# Patient Record
Sex: Male | Born: 1960 | Race: Black or African American | Hispanic: No | Marital: Single | Smoking: Never smoker
Health system: Southern US, Community
[De-identification: ages and names within clinical notes are randomized; demographics above are authoritative.]

## PROBLEM LIST (undated history)

## (undated) DIAGNOSIS — R51 Headache: Secondary | ICD-10-CM

## (undated) DIAGNOSIS — R519 Headache, unspecified: Secondary | ICD-10-CM

## (undated) DIAGNOSIS — I10 Essential (primary) hypertension: Secondary | ICD-10-CM

## (undated) HISTORY — PX: WISDOM TOOTH EXTRACTION: SHX21

## (undated) HISTORY — DX: Headache: R51

## (undated) HISTORY — DX: Essential (primary) hypertension: I10

## (undated) HISTORY — DX: Headache, unspecified: R51.9

---

## 2003-04-26 ENCOUNTER — Ambulatory Visit (HOSPITAL_COMMUNITY): Admission: RE | Admit: 2003-04-26 | Discharge: 2003-04-26 | Payer: Self-pay | Admitting: Internal Medicine

## 2003-04-26 ENCOUNTER — Encounter: Payer: Self-pay | Admitting: Internal Medicine

## 2003-05-31 ENCOUNTER — Ambulatory Visit (HOSPITAL_COMMUNITY): Admission: RE | Admit: 2003-05-31 | Discharge: 2003-05-31 | Payer: Self-pay | Admitting: Orthopedic Surgery

## 2003-05-31 ENCOUNTER — Encounter: Payer: Self-pay | Admitting: Orthopedic Surgery

## 2012-07-09 ENCOUNTER — Ambulatory Visit: Payer: BC Managed Care – PPO

## 2012-07-09 ENCOUNTER — Ambulatory Visit (INDEPENDENT_AMBULATORY_CARE_PROVIDER_SITE_OTHER): Payer: BC Managed Care – PPO | Admitting: Internal Medicine

## 2012-07-09 VITALS — BP 143/86 | HR 66 | Temp 98.4°F | Resp 12 | Ht 72.0 in | Wt 147.0 lb

## 2012-07-09 DIAGNOSIS — M25569 Pain in unspecified knee: Secondary | ICD-10-CM

## 2012-07-09 DIAGNOSIS — M7050 Other bursitis of knee, unspecified knee: Secondary | ICD-10-CM

## 2012-07-09 MED ORDER — PREDNISONE 20 MG PO TABS: ORAL_TABLET | ORAL | Status: DC

## 2012-07-09 MED ORDER — MELOXICAM 15 MG PO TABS: 15.0000 mg | ORAL_TABLET | Freq: Every day | ORAL | Status: DC

## 2012-07-09 NOTE — Progress Notes (Signed)
  Subjective:    Patient ID: Logan Larson, male    DOB: 05/13/1961, 50 y.o.   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 Geralyn Flash      Assessment & Plan:  P#1 Infrapat bursitis  Meds ordered this encounter  Medications  . predniSONE (DELTASONE) 20 MG tablet    Sig: 3/3/2/2/1/1/ single daily dose for 6 days    Dispense:  12 tablet    Refill:  0  . meloxicam (MOBIC) 15 MG tablet    Sig: Take 1 tablet (15 mg total) by mouth daily.    Dispense:  30 tablet    Refill:  0   Brace OOW 3-7 days w/ f/u 1 week unless 100% He is reluctant to take sick days

## 2012-07-10 ENCOUNTER — Encounter: Payer: Self-pay | Admitting: Internal Medicine

## 2012-07-11 ENCOUNTER — Telehealth: Payer: Self-pay

## 2012-07-11 DIAGNOSIS — M25469 Effusion, unspecified knee: Secondary | ICD-10-CM

## 2012-07-11 NOTE — Telephone Encounter (Signed)
PT STATES WE WERE GOING TO CALL REGARDING HIS XRAYS AND HE HASN'T HEARD FROM ANYONE. ALSO NEED TO HAVE THE OOW NOTE MODIFIED PLEASE CALL [phone]

## 2012-07-11 NOTE — Telephone Encounter (Signed)
Large knee effusion. No osseous abnormality. Question internal  derangement of the knee. Consider MRI for further assessment.   Called patient Dr Merla Riches has recommended a MRI scan, he is advised of the report. He states swelling is going down. I have clarified his work note and left at front desk for him to pick up. MRI ordered.

## 2012-07-16 ENCOUNTER — Ambulatory Visit
Admission: RE | Admit: 2012-07-16 | Discharge: 2012-07-16 | Disposition: A | Discharge status: Home / Self Care | Payer: BC Managed Care – PPO | Source: Ambulatory Visit | Attending: Internal Medicine | Admitting: Internal Medicine

## 2012-07-16 DIAGNOSIS — M25469 Effusion, unspecified knee: Secondary | ICD-10-CM

## 2012-07-21 ENCOUNTER — Telehealth: Payer: Self-pay

## 2012-07-21 DIAGNOSIS — M25461 Effusion, right knee: Secondary | ICD-10-CM

## 2012-07-21 DIAGNOSIS — M25569 Pain in unspecified knee: Secondary | ICD-10-CM

## 2012-07-21 DIAGNOSIS — S83249A Other tear of medial meniscus, current injury, unspecified knee, initial encounter: Secondary | ICD-10-CM

## 2012-07-21 NOTE — Telephone Encounter (Signed)
Called pt and spoke to Denita on pt's HIPPA. She explained that pt was on other phone. Gave her pt's results and explained pt needs referral to ortho surg. She agreed for [place] to start referral. Put in order for referral.

## 2012-07-21 NOTE — Telephone Encounter (Signed)
Pt would like to know results of mri best number is [phone]

## 2012-08-25 ENCOUNTER — Ambulatory Visit (INDEPENDENT_AMBULATORY_CARE_PROVIDER_SITE_OTHER): Payer: BC Managed Care – PPO | Admitting: Emergency Medicine

## 2012-08-25 VITALS — BP 173/108 | HR 64 | Temp 98.3°F | Resp 16 | Ht 69.0 in

## 2012-08-25 DIAGNOSIS — R079 Chest pain, unspecified: Secondary | ICD-10-CM

## 2012-08-25 DIAGNOSIS — M940 Chondrocostal junction syndrome [Tietze]: Secondary | ICD-10-CM

## 2012-08-25 MED ORDER — NAPROXEN SODIUM 550 MG PO TABS: 550.0000 mg | ORAL_TABLET | Freq: Two times a day (BID) | ORAL | Status: AC

## 2012-08-25 NOTE — Patient Instructions (Addendum)
Costochondritis Costochondritis (Tietze syndrome), or costochondral separation, is a swelling and irritation (inflammation) of the tissue (cartilage) that connects your ribs with your breastbone (sternum). It may occur on its own (spontaneously), through damage caused by an accident (trauma), or simply from coughing or minor exercise. It may take up to 6 weeks to get better and longer if you are unable to be conservative in your activities. HOME CARE INSTRUCTIONS   Avoid exhausting physical activity. Try not to strain your ribs during normal activity. This would include any activities using chest, belly (abdominal), and side muscles, especially if heavy weights are used.  Use ice for 15 to 20 minutes per hour while awake for the first 2 days. Place the ice in a plastic bag, and place a towel between the bag of ice and your skin.  Only take over-the-counter or prescription medicines for pain, discomfort, or fever as directed by your caregiver. SEEK IMMEDIATE MEDICAL CARE IF:   Your pain increases or you are very uncomfortable.  You have a fever.  You develop difficulty with your breathing.  You cough up blood.  You develop worse chest pains, shortness of breath, sweating, or vomiting.  You develop new, unexplained problems (symptoms). MAKE SURE YOU:   Understand these instructions.  Will watch your condition.  Will get help right away if you are not doing well or get worse. Document Released: 05/19/2005 Document Revised: 11/01/2011 Document Reviewed: 03/27/2008 ExitCare Patient Information 2013 [employer].  

## 2012-08-25 NOTE — Progress Notes (Signed)
[facility] [address], [place] [address] [phone] [phone]  Date:  08/25/2012   Name:  Logan Larson   DOB:  01-08-1961   MRN:  [id number]  PCP:  No primary provider on file.    Chief Complaint: Shoulder Pain   History of Present Illness:  Logan Larson is a 52 y.o. very pleasant male patient who presents with the following:  Burning pain in anterior chest below right clavicle.  Started in posterior shoulder yesterday no history of overuse or injury.  No shoulder pain.  No chest pain.  No fever, chills, cough, nausea or vomiting.  No wheezing or shortness of breath.  There is no problem list on file for this patient.   No past medical history on file.  No past surgical history on file.  History  Substance Use Topics  . Smoking status: Never Smoker   . Smokeless tobacco: Not on file  . Alcohol Use: No    No family history on file.  No Known Allergies  Medication list has been reviewed and updated.  Current Outpatient Prescriptions on File Prior to Visit  Medication Sig Dispense Refill  . meloxicam (MOBIC) 15 MG tablet Take 1 tablet (15 mg total) by mouth daily.  30 tablet  0  . predniSONE (DELTASONE) 20 MG tablet 3/3/2/2/1/1/ single daily dose for 6 days  12 tablet  0    Review of Systems:  As per HPI, otherwise negative.    Physical Examination: Filed Vitals:   08/25/12 1444  BP: 173/108  Pulse: 64  Temp: 98.3 F (36.8 C)  Resp: 16   Filed Vitals:   08/25/12 1444  Height: 5\' 9"  (1.753 m)   There is no weight on file to calculate BMI. Ideal Body Weight: Weight in (lb) to have BMI = 25: 168.9    GEN: WDWN, NAD, Non-toxic, Alert & Oriented x 3 HEENT: Atraumatic, Normocephalic.  Ears and Nose: No external deformity. EXTR: No clubbing/cyanosis/edema NEURO: Normal gait.  PSYCH: Normally interactive. Conversant. Not depressed or anxious appearing.  Calm demeanor.  CHEST WALL:  Tender points 3, 4, 5th costochondral junctions.   Reproduce pain he has experienced  Assessment and Plan: costochondrosis Anaprox Follow up as needed Injected two ribs with 1 ml marcaine and 80 mg depo medrol each site   Carmelina Dane, MD

## 2013-03-22 ENCOUNTER — Ambulatory Visit: Payer: BC Managed Care – PPO

## 2013-03-22 ENCOUNTER — Ambulatory Visit (INDEPENDENT_AMBULATORY_CARE_PROVIDER_SITE_OTHER): Payer: BC Managed Care – PPO | Admitting: Internal Medicine

## 2013-03-22 VITALS — BP 130/82 | HR 58 | Temp 97.7°F | Resp 18 | Ht 72.5 in | Wt 150.0 lb

## 2013-03-22 DIAGNOSIS — M7052 Other bursitis of knee, left knee: Secondary | ICD-10-CM

## 2013-03-22 DIAGNOSIS — M2540 Effusion, unspecified joint: Secondary | ICD-10-CM

## 2013-03-22 DIAGNOSIS — M76899 Other specified enthesopathies of unspecified lower limb, excluding foot: Secondary | ICD-10-CM

## 2013-03-22 DIAGNOSIS — M6740 Ganglion, unspecified site: Secondary | ICD-10-CM

## 2013-03-22 DIAGNOSIS — M67462 Ganglion, left knee: Secondary | ICD-10-CM

## 2013-03-22 DIAGNOSIS — M25462 Effusion, left knee: Secondary | ICD-10-CM

## 2013-03-22 NOTE — Progress Notes (Signed)
  Subjective:    Patient ID: Logan Larson, male    DOB: [birth year], 52 y.o.   MRN: [id number]  HPI Pt presents to clinic today with L knee swelling. He had R arthroscopic knee surgery about 5 weeks ago and he thinks he may have been walking different the past couple weeks since the surgery. Earlier this week he describes that he kneeled down to bend his knees to work on ROM and since then he noticed some L knee swelling but no pain. The surgery was on right knee. 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   Review of Systems     Objective:   Physical Exam  Constitutional: He is oriented to person, place, and time. He appears well-developed and well-nourished. No distress.  HENT:  Nose: Nose normal.  Eyes: EOM are normal.  Pulmonary/Chest: Effort normal.  Musculoskeletal: Normal range of motion. He exhibits edema. He exhibits no tenderness.       Left knee: He exhibits swelling. He exhibits normal range of motion, no effusion, no ecchymosis, no deformity, no erythema, normal alignment, no LCL laxity, no bony tenderness, normal meniscus and no MCL laxity. Tenderness found. Patellar tendon tenderness noted. No medial joint line, no lateral joint line, no MCL and no LCL tenderness noted.       Legs: Cystic swelling under patella tendon.  Neurological: He is alert and oriented to person, place, and time. He exhibits normal muscle tone. Coordination normal.  Skin: No rash noted. No erythema.  Psychiatric: He has a normal mood and affect. His behavior is normal.   [facility] reading (PRIMARY) by  Dr Perrin Maltese normal knee xr  Sterile prep/Xylocain local/Aspirate swelling and no fluid obtained Sterile dressing.       Assessment & Plan:  Infra patella cystic swelling/Probable ganglion Wear knee pads/See orthopedist

## 2013-03-22 NOTE — Patient Instructions (Signed)
Ganglion Cyst °A ganglion cyst is a noncancerous, fluid-filled lump that occurs near joints or tendons. The ganglion cyst grows out of a joint or the lining of a tendon. It most often develops in the hand or wrist but can also develop in the shoulder, elbow, hip, knee, ankle, or foot. The round or oval ganglion can be pea sized or larger than a grape. Increased activity may enlarge the size of the cyst because more fluid starts to build up.  °CAUSES  °It is not completely known what causes a ganglion cyst to grow. However, it may be related to: °· Inflammation or irritation around the joint. °· An injury. °· Repetitive movements or overuse. °· Arthritis. °SYMPTOMS  °A lump most often appears in the hand or wrist, but can occur in other areas of the body. Generally, the lump is painless without other symptoms. However, sometimes pain can be felt during activity or when pressure is applied to the lump. The lump may even be tender to the touch. Tingling, pain, numbness, or muscle weakness can occur if the ganglion cyst presses on a nerve. Your grip may be weak and you may have less movement in your joints.  °DIAGNOSIS  °Ganglion cysts are most often diagnosed based on a physical exam, noting where the cyst is and how it looks. Your caregiver will feel the lump and may shine a light alongside it. If it is a ganglion, a light often shines through it. Your caregiver may order an X-ray, ultrasound, or MRI to rule out other conditions. °TREATMENT  °Ganglions usually go away on their own without treatment. If pain or other symptoms are involved, treatment may be needed. Treatment is also needed if the ganglion limits your movement or if it gets infected. Treatment options include: °· Wearing a wrist or finger brace or splint. °· Taking anti-inflammatory medicine. °· Draining fluid from the lump with a needle (aspiration). °· Injecting a steroid into the joint. °· Surgery to remove the ganglion cyst and its stalk that is  attached to the joint or tendon. However, ganglion cysts can grow back. °HOME CARE INSTRUCTIONS  °· Do not press on the ganglion, poke it with a needle, or hit it with a heavy object. You may rub the lump gently and often. Sometimes fluid moves out of the cyst. °· Only take medicines as directed by your caregiver. °· Wear your brace or splint as directed by your caregiver. °SEEK MEDICAL CARE IF:  °· Your ganglion becomes larger or more painful. °· You have increased redness, red streaks, or swelling. °· You have pus coming from the lump. °· You have weakness or numbness in the affected area. °MAKE SURE YOU:  °· Understand these instructions. °· Will watch your condition. °· Will get help right away if you are not doing well or get worse. °Document Released: 08/06/2000 Document Revised: 05/03/2012 Document Reviewed: 10/03/2007 °ExitCare® Patient Information ©2014 [employer]. ° °

## 2013-08-23 HISTORY — PX: KNEE SURGERY: SHX244

## 2014-01-07 IMAGING — CR DG KNEE 1-2V*R*
2 series · 2 of 2 positions shown · non-contrast
Comparison: None.

CLINICAL DATA: Right knee pain.  No injury.

RIGHT KNEE - 1-2 VIEW

[AP]
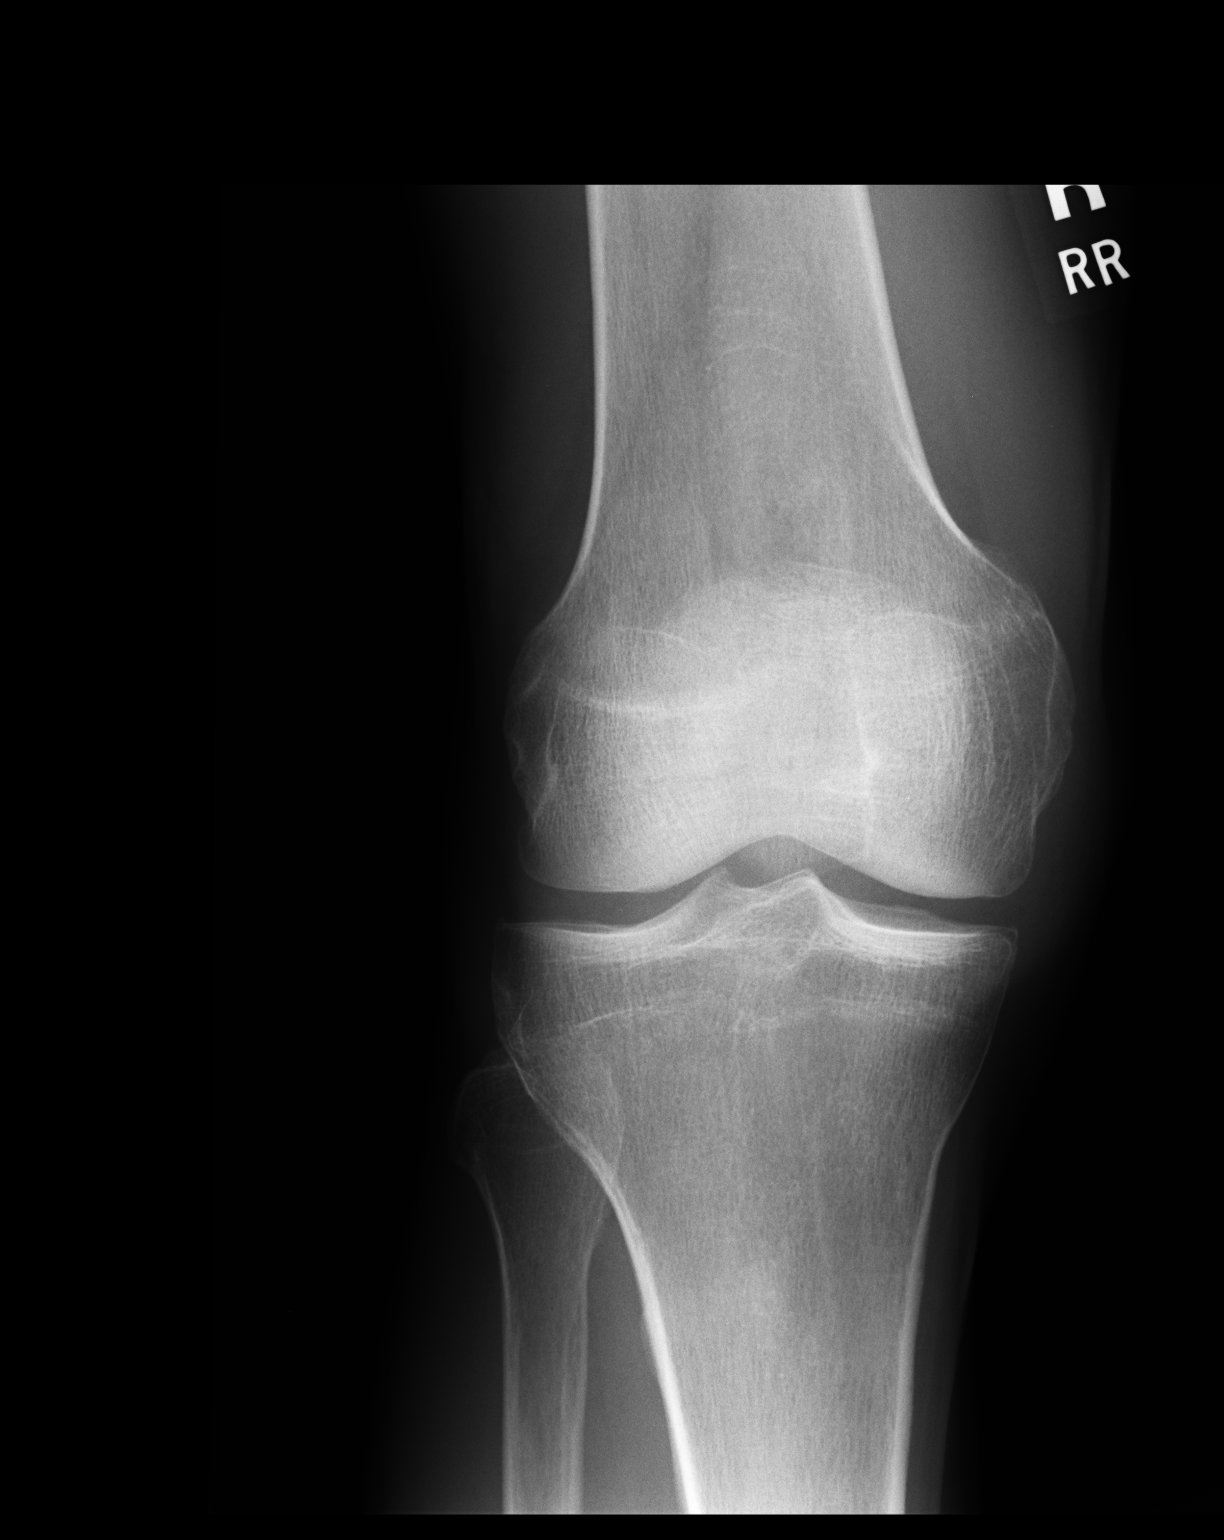

[lateral]
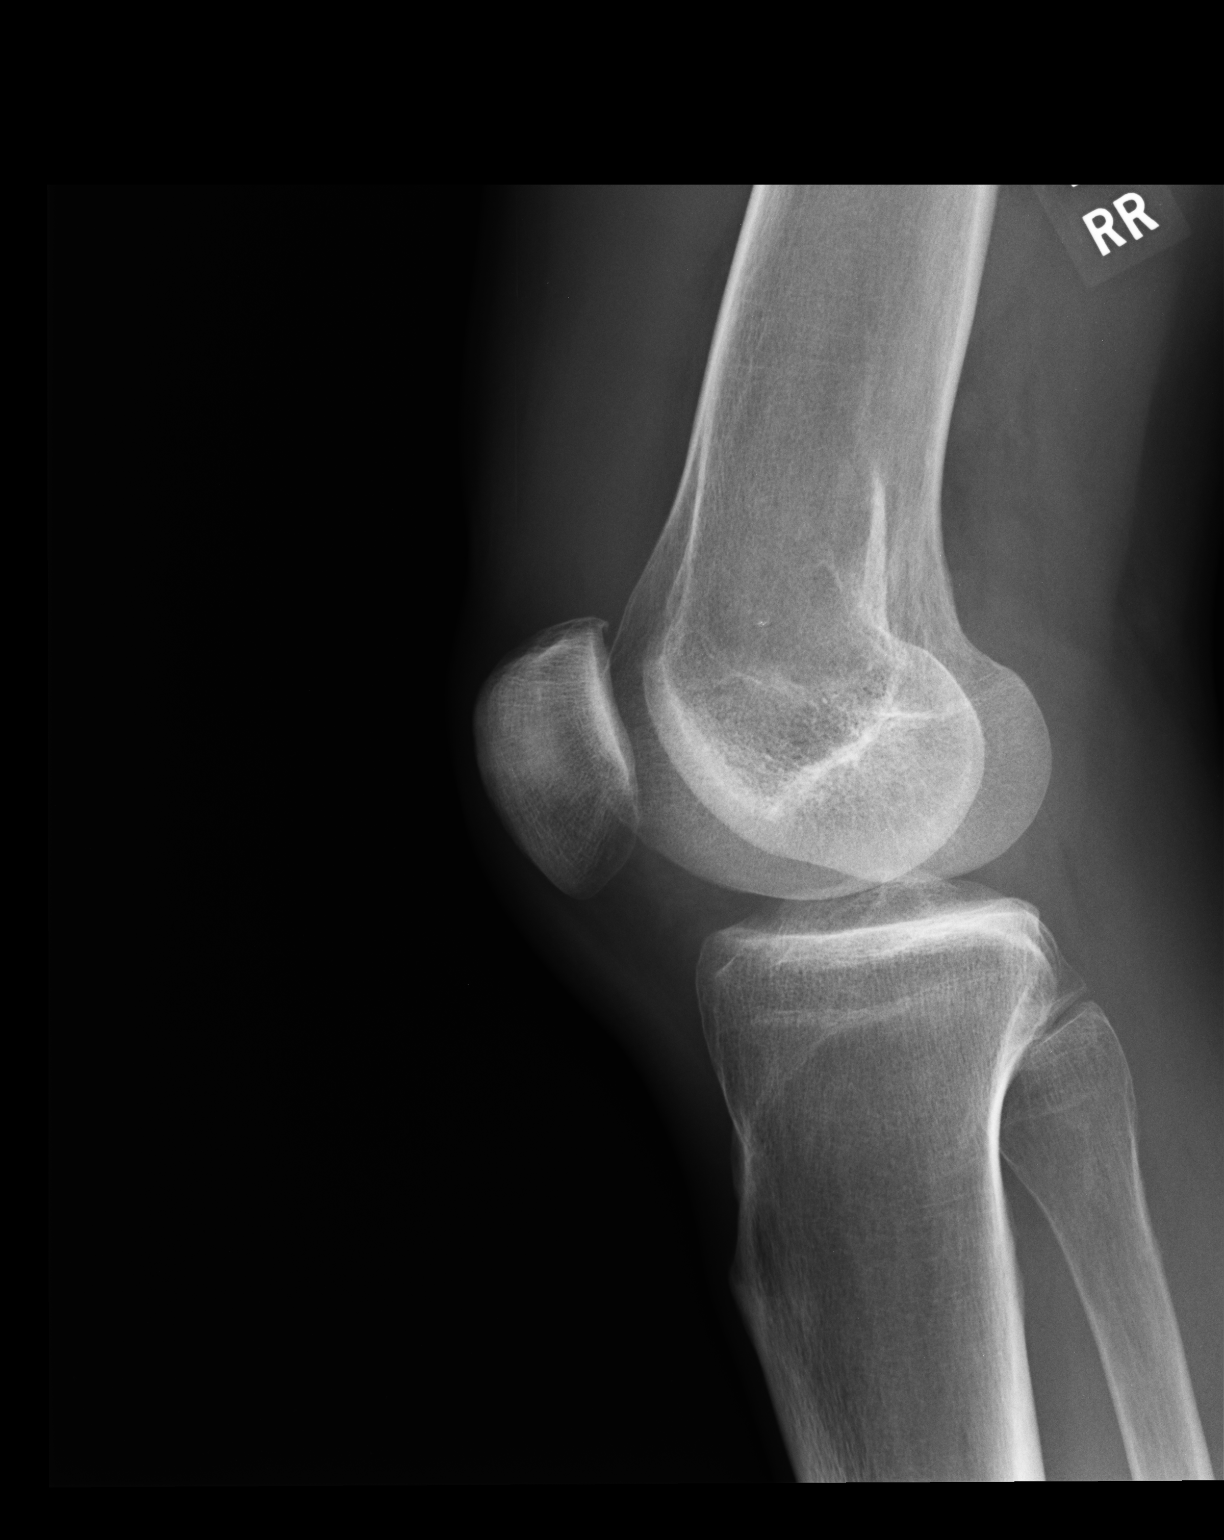

[2 of 2 positions shown; findings below may reference images not displayed]

FINDINGS: Anatomic alignment.  Large effusion.  Mild patellofemoral
osteoarthritis.
IMPRESSION: Large knee effusion.  No osseous abnormality.  Question internal
derangement of the knee.  Consider MRI for further assessment.

## 2015-11-26 DIAGNOSIS — M9905 Segmental and somatic dysfunction of pelvic region: Secondary | ICD-10-CM | POA: Diagnosis not present

## 2015-11-26 DIAGNOSIS — M9903 Segmental and somatic dysfunction of lumbar region: Secondary | ICD-10-CM | POA: Diagnosis not present

## 2015-11-26 DIAGNOSIS — M5126 Other intervertebral disc displacement, lumbar region: Secondary | ICD-10-CM | POA: Diagnosis not present

## 2015-11-26 DIAGNOSIS — M9904 Segmental and somatic dysfunction of sacral region: Secondary | ICD-10-CM | POA: Diagnosis not present

## 2016-03-18 ENCOUNTER — Encounter (HOSPITAL_COMMUNITY): Payer: Self-pay | Admitting: Emergency Medicine

## 2016-03-18 ENCOUNTER — Ambulatory Visit (HOSPITAL_COMMUNITY)
Admission: EM | Admit: 2016-03-18 | Discharge: 2016-03-18 | Disposition: A | Discharge status: Home / Self Care | Payer: BLUE CROSS/BLUE SHIELD | Attending: Physician Assistant | Admitting: Physician Assistant

## 2016-03-18 ENCOUNTER — Ambulatory Visit (INDEPENDENT_AMBULATORY_CARE_PROVIDER_SITE_OTHER): Payer: BLUE CROSS/BLUE SHIELD

## 2016-03-18 DIAGNOSIS — M940 Chondrocostal junction syndrome [Tietze]: Secondary | ICD-10-CM

## 2016-03-18 DIAGNOSIS — R0789 Other chest pain: Secondary | ICD-10-CM | POA: Diagnosis not present

## 2016-03-18 DIAGNOSIS — R079 Chest pain, unspecified: Secondary | ICD-10-CM | POA: Diagnosis not present

## 2016-03-18 MED ORDER — HYDROCODONE-ACETAMINOPHEN 5-325 MG PO TABS: 1.0000 | ORAL_TABLET | Freq: Four times a day (QID) | ORAL | 0 refills | Status: DC | PRN

## 2016-03-18 MED ORDER — KETOROLAC TROMETHAMINE 60 MG/2ML IM SOLN
60.0000 mg | Freq: Once | INTRAMUSCULAR | Status: AC
  Administered 2016-03-18: 60 mg via INTRAMUSCULAR

## 2016-03-18 MED ORDER — KETOROLAC TROMETHAMINE 60 MG/2ML IM SOLN
INTRAMUSCULAR | Status: AC
  Filled 2016-03-18: qty 2

## 2016-03-18 MED ORDER — INDOMETHACIN 50 MG PO CAPS: 50.0000 mg | ORAL_CAPSULE | Freq: Two times a day (BID) | ORAL | 0 refills | Status: DC

## 2016-03-18 NOTE — Discharge Instructions (Signed)
CALL ME IF THERE ARE ANY NEW OR WORSENING OF SYMPTOMS.  [phone] Barbra Sarks

## 2016-03-18 NOTE — ED Triage Notes (Signed)
The patient presented to the [facility] with a complaint of right sided chest pain that radiates to his right shoulder. The patient described the pain as a muscle spasm. The patient denied any cardia hx.

## 2016-03-18 NOTE — ED Provider Notes (Signed)
CSN: [id number]     Arrival date & time 03/18/16  1123 History   First MD Initiated Contact with Patient 03/18/16 1214     Chief Complaint  Patient presents with  . Chest Pain  . Shoulder Pain   (Consider location/radiation/quality/duration/timing/severity/associated sxs/prior Treatment) HPI History obtained from patient:  Pt presents with the cc of:  Right upper chest wall pain Duration of symptoms: One week Treatment prior to arrival: No treatment Context: Sudden onset of right upper chest wall pain radiating to his right shoulder. No known injury. Patient works as Naval architect. Other symptoms include: Pain with movement and deep breathing Pain score: 4 or 5 FAMILY HISTORY: Heart disease    History reviewed. No pertinent past medical history. Past Surgical History:  Procedure Laterality Date  . KNEE SURGERY     History reviewed. No pertinent family history. Social History  Substance Use Topics  . Smoking status: Never Smoker  . Smokeless tobacco: Never Used  . Alcohol use No    Review of Systems  Denies: HEADACHE, NAUSEA, ABDOMINAL PAIN,   CONGESTION, DYSURIA, SHORTNESS OF BREATH  Allergies  Review of patient's allergies indicates no known allergies.  Home Medications   Prior to Admission medications   Medication Sig Start Date End Date Taking? Authorizing Provider  HYDROcodone-acetaminophen (NORCO/VICODIN) 5-325 MG tablet Take 1 tablet by mouth every 6 (six) hours as needed. 03/18/16   Tharon Aquas, PA  indomethacin (INDOCIN) 50 MG capsule Take 1 capsule (50 mg total) by mouth 2 (two) times daily with a meal. 03/18/16   Tharon Aquas, PA  meloxicam (MOBIC) 15 MG tablet Take 1 tablet (15 mg total) by mouth daily. 07/09/12   Tonye Pearson, MD  predniSONE (DELTASONE) 20 MG tablet 3/3/2/2/1/1/ single daily dose for 6 days 07/09/12   Tonye Pearson, MD   Meds Ordered and Administered this Visit   Medications  ketorolac (TORADOL) injection 60 mg (60 mg  Intramuscular Given 03/18/16 1318)    Pulse (!) 58 Comment: Reported HR to [facility]  Temp 98.1 F (36.7 C) (Oral)   Resp 14   SpO2 100%  No data found.   Physical Exam NURSES NOTES AND VITAL SIGNS REVIEWED. CONSTITUTIONAL: Well developed, well nourished, no acute distress HEENT: normocephalic, atraumatic EYES: Conjunctiva normal NECK:normal ROM, supple, no adenopathy PULMONARY:No respiratory distress, normal effort ABDOMINAL: Soft, ND, NT BS+, No CVAT MUSCULOSKELETAL: Normal ROM of all extremities, right upper chest wall is point tender to palpation. Pain is worse with movement.  SKIN: warm and dry without rash PSYCHIATRIC: Mood and affect, behavior are normal  Urgent Care Course   Clinical Course    Procedures (including critical care time)  Labs Review Labs Reviewed - No data to display  Imaging Review Dg Chest 2 View  Result Date: 03/18/2016 CLINICAL DATA:  Chest pain for 1 week EXAM: CHEST  2 VIEW COMPARISON:  None. FINDINGS: There is no edema or consolidation. Heart is upper normal in size with pulmonary vascularity within normal limits. No adenopathy. There is a small focus of calcification in the aortic arch. No bone lesions. IMPRESSION: Mild aortic atherosclerosis.  No edema or consolidation. Electronically Signed   By: Bretta Bang III M.D.   On: 03/18/2016 12:48  dISCUSSED WITH PATIENT AND WIFE PRIOR TO DISCHARGE  Visual Acuity Review  Right Eye Distance:   Left Eye Distance:   Bilateral Distance:    Right Eye Near:   Left Eye Near:    Bilateral Near:  POINT TENDER CHEST WALL PAIN. NOT suspicious for cardiac disease. Symptoms have been present for over 1 week.   Prescriptions for indomethacin and hydrocodone provided.  MDM   1. Chest wall pain   2. Costochondritis    NURSES NOTES AND VITAL SIGNS REVIEWED. CONSTITUTIONAL: Well developed, well nourished, no acute distress HEENT: normocephalic, atraumatic EYES: Conjunctiva  normal NECK:normal ROM, supple, no adenopathy PULMONARY:No respiratory distress, normal effort ABDOMINAL: Soft, ND, NT BS+, No CVAT MUSCULOSKELETAL: Normal ROM of all extremities,  SKIN: warm and dry without rash PSYCHIATRIC: Mood and affect, behavior are normal     Tharon Aquas, PA 03/18/16 1329

## 2016-12-01 ENCOUNTER — Ambulatory Visit (HOSPITAL_COMMUNITY)
Admission: EM | Admit: 2016-12-01 | Discharge: 2016-12-01 | Disposition: A | Discharge status: Home / Self Care | Payer: BLUE CROSS/BLUE SHIELD | Attending: Family Medicine | Admitting: Family Medicine

## 2016-12-01 ENCOUNTER — Encounter (HOSPITAL_COMMUNITY): Payer: Self-pay | Admitting: Emergency Medicine

## 2016-12-01 DIAGNOSIS — I10 Essential (primary) hypertension: Secondary | ICD-10-CM

## 2016-12-01 DIAGNOSIS — G44209 Tension-type headache, unspecified, not intractable: Secondary | ICD-10-CM

## 2016-12-01 DIAGNOSIS — Z566 Other physical and mental strain related to work: Secondary | ICD-10-CM | POA: Diagnosis not present

## 2016-12-01 MED ORDER — NAPROXEN 375 MG PO TABS: 375.0000 mg | ORAL_TABLET | Freq: Two times a day (BID) | ORAL | 0 refills | Status: DC

## 2016-12-01 NOTE — ED Notes (Signed)
No stickers

## 2016-12-01 NOTE — ED Triage Notes (Addendum)
Intermittent headache and intermittent chest discomfort, intermittent.  "pain going in and out, doesn't hurt, hurt".  Denies cough, cold runny nose  Head is pounding, intermittent  Patient thinks this is stress related

## 2016-12-01 NOTE — ED Provider Notes (Signed)
CSN: [id number]     Arrival date & time 12/01/16  1400 History   None    Chief Complaint  Patient presents with  . Headache   (Consider location/radiation/quality/duration/timing/severity/associated sxs/prior Treatment) 56 year old male complaining of intermittent throbbing-type headaches for the past week associated with left anterior chest wall pain. 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 His wife states that she has noticed changes because of the stress. The symptoms did not start until he obtains second job which required a greater amount of work and he told. The headaches are located primarily bitemporal and occasionally occipital. They are not associated with problems with vision, speech, hearing, swallowing, focal paresthesias or weakness. No dizziness, syncope, confusion or problems with memory.  He states his chest pain is left anterior and is elicited with certain movements, coughing and deep breathing. No fever or chills. No feelings of pressure, heaviness or tightness. No known history of heart disease.      History reviewed. No pertinent past medical history. Past Surgical History:  Procedure Laterality Date  . KNEE SURGERY     No family history on file. Social History  Substance Use Topics  . Smoking status: Never Smoker  . Smokeless tobacco: Never Used  . Alcohol use No    Review of Systems  Constitutional: Negative.   HENT: Negative.   Respiratory: Negative.   Cardiovascular: Positive for chest pain.  Gastrointestinal: Negative.   Genitourinary: Negative.   Skin: Negative.   Neurological: Positive for headaches.  All other systems reviewed and are negative.   Allergies  Patient has no known allergies.  Home Medications   Prior to Admission medications   Medication Sig Start Date End Date Taking? Authorizing Provider   HYDROcodone-acetaminophen (NORCO/VICODIN) 5-325 MG tablet Take 1 tablet by mouth every 6 (six) hours as needed. 03/18/16   Tharon Aquas, PA  indomethacin (INDOCIN) 50 MG capsule Take 1 capsule (50 mg total) by mouth 2 (two) times daily with a meal. 03/18/16   Tharon Aquas, PA  meloxicam (MOBIC) 15 MG tablet Take 1 tablet (15 mg total) by mouth daily. 07/09/12   Tonye Pearson, MD  naproxen (NAPROSYN) 375 MG tablet Take 1 tablet (375 mg total) by mouth 2 (two) times daily. 12/01/16   Hayden Rasmussen, NP  predniSONE (DELTASONE) 20 MG tablet 3/3/2/2/1/1/ single daily dose for 6 days 07/09/12   Tonye Pearson, MD   Meds Ordered and Administered this Visit  Medications - No data to display  BP (!) 170/97 (BP Location: Left Arm)   Pulse 74   Temp 98.1 F (36.7 C) (Oral)   Resp 18   SpO2 97%  No data found.   Physical Exam  Constitutional: He is oriented to person, place, and time. He appears well-developed and well-nourished. No distress.  HENT:  Head: Normocephalic and atraumatic.  Eyes: EOM are normal. Pupils are equal, round, and reactive to light.  Neck: Normal range of motion. Neck supple.  Cardiovascular: Normal rate, regular rhythm, normal heart sounds and intact distal pulses.   Pulmonary/Chest: Effort normal and breath sounds normal. No respiratory distress. He has no wheezes. He has no rales. He exhibits tenderness.  Musculoskeletal: Normal range of motion.  Lymphadenopathy:    He has no cervical adenopathy.  Neurological: He is alert and oriented to person, place, and time. He has normal strength.  He displays normal reflexes. No cranial nerve deficit or sensory deficit. He exhibits normal muscle tone. Coordination normal. GCS eye subscore is 4. GCS verbal subscore is 5. GCS motor subscore is 6.  Skin: Skin is warm and dry.  Psychiatric: He has a normal mood and affect.  Nursing note and vitals reviewed.   Urgent Care Course     Procedures (including critical care  time)  Labs Review Labs Reviewed - No data to display  Imaging Review No results found.   Visual Acuity Review  Right Eye Distance:   Left Eye Distance:   Bilateral Distance:    Right Eye Near:   Left Eye Near:    Bilateral Near:         MDM   1. Acute non intractable tension-type headache   2. Hypertension, unspecified type   3. Stress at work    Off of work for the these 3 days. Follow-up with your primary care doctor on Wednesday. Meanwhile if you have headache or chest pain take the medicine prescribed. Best taken with food. Try some relaxation techniques. There are medicines that do help tolerate with stresses as well. Meds ordered this encounter  Medications  . naproxen (NAPROSYN) 375 MG tablet    Sig: Take 1 tablet (375 mg total) by mouth 2 (two) times daily.    Dispense:  20 tablet    Refill:  0    Order Specific Question:   Supervising Provider    Answer:   Elvina Sidle [[id number]]       Hayden Rasmussen, NP 12/01/16 1744

## 2016-12-01 NOTE — Discharge Instructions (Signed)
Off of work for the these 3 days. Follow-up with your primary care doctor on Wednesday. Meanwhile if you have headache or chest pain take the medicine prescribed. Best taken with food. Try some relaxation techniques. There are medicines that do help tolerate with stresses as well.

## 2016-12-08 ENCOUNTER — Encounter: Payer: Self-pay | Admitting: Adult Health

## 2016-12-08 ENCOUNTER — Ambulatory Visit (INDEPENDENT_AMBULATORY_CARE_PROVIDER_SITE_OTHER): Payer: BLUE CROSS/BLUE SHIELD | Admitting: Adult Health

## 2016-12-08 VITALS — BP 140/98 | Temp 98.6°F | Ht 72.5 in | Wt 152.4 lb

## 2016-12-08 DIAGNOSIS — Z7689 Persons encountering health services in other specified circumstances: Secondary | ICD-10-CM

## 2016-12-08 DIAGNOSIS — Z23 Encounter for immunization: Secondary | ICD-10-CM

## 2016-12-08 DIAGNOSIS — I10 Essential (primary) hypertension: Secondary | ICD-10-CM | POA: Diagnosis not present

## 2016-12-08 NOTE — Progress Notes (Signed)
Patient presents to clinic today to establish care. He is a pleasant 56 year old male who  has a past medical history of Frequent headaches.   He has never had a complete physical exam done.    Acute Concerns: Establish Care    Chronic Issues: Frequent  Headaches. - was seen in the ER for this 7 days ago. He reports that he believes his headaches are stress reduced. He has recently started a second job with the city of [employer]. He is on call a lot of the time and is constantly having to fix other workers mistakes. He was taken out of work after the ER visit and since that time his stress level has decreased and he is no longer experiencing headaches. No labs or imaging was done in the ER.   In the ER he also had an elevated blood pressure reading of 170/97. He denies being on blood pressure medication in the past. His blood pressure is lower today in the office 140/98  Health Maintenance: Dental -- Does do routine care [facility] -- 2016  Immunizations -- Needs Tdap  Colonoscopy -- Never had  Diet: Does not follow a specific diet. He eats a lot of fast food.  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 Frequent headaches     Past Surgical History:  Procedure Laterality Date  . KNEE SURGERY Right 2015    No current outpatient prescriptions on file prior to visit.   No current facility-administered medications on file prior to visit.     No Known Allergies  Family History  Problem Relation Age of Onset  . Heart disease Mother   . Hypertension Mother   . Diabetes Mother     Social History   Social History  . Marital status: Single    Spouse name: N/A  . Number of children: N/A  . Years of education: N/A   Occupational History  . Not on file.   Social History Main Topics  . Smoking status: Never Smoker  . Smokeless tobacco: Never Used  . Alcohol use No  . Drug use: No  . Sexual activity: Yes     Other Topics Concern  . Not on file   Social History Narrative  . No narrative on file    Review of Systems  Constitutional: Negative.   HENT: Negative.   Eyes: Negative.   Respiratory: Negative.   Cardiovascular: Negative.   Genitourinary: Negative.   Musculoskeletal: Negative.   Skin: Negative.   Neurological: Negative.   Psychiatric/Behavioral: Negative.   All other systems reviewed and are negative.   BP (!) 140/98 (BP Location: Left Arm, Patient Position: Sitting, Cuff Size: Normal)   Temp 98.6 F (37 C) (Oral)   Ht 6' 0.5" (1.842 m)   Wt 152 lb 6.4 oz (69.1 kg)   BMI 20.39 kg/m   Physical Exam  Constitutional: He is oriented to person, place, and time and well-developed, well-nourished, and in no distress. No distress.  HENT:  Head: Normocephalic and atraumatic.  Right Ear: External ear normal.  Left Ear: External ear normal.  Nose: Nose normal.  Mouth/Throat: Oropharynx is clear and moist. No oropharyngeal exudate.  Eyes: Conjunctivae and EOM are normal. Pupils are equal, round, and reactive to light. Right eye exhibits no discharge. Left eye exhibits no discharge.  Cardiovascular: Normal rate, regular rhythm, normal heart sounds and intact distal pulses.  Exam  reveals no gallop and no friction rub.   No murmur heard. Pulmonary/Chest: Effort normal and breath sounds normal. No respiratory distress. He has no wheezes. He has no rales. He exhibits no tenderness.  Neurological: He is alert and oriented to person, place, and time. Gait normal. GCS score is 15.  Skin: Skin is warm and dry. No rash noted. He is not diaphoretic. No erythema. No pallor.  Psychiatric: Mood, memory, affect and judgment normal.  Nursing note and vitals reviewed.  Assessment/Plan: 1. Encounter to establish care - Follow up for CPE or sooner if needed - Needs to make some dietary changes  - Continue to stay active   2. Need for Tdap vaccination  - Tdap vaccine greater than or equal  to 7yo IM  3. Hypertension, unspecified type - I am going to reassess in one month at his physical. I would like him to make some dietary changes. Possible stress response?  - Consider adding low dose ACE   Shirline Frees, NP

## 2016-12-08 NOTE — Patient Instructions (Addendum)
It was great meeting you today!   Please follow up with me in one month for your physical   Someone will call you to schedule your colonoscopy.   Please take some time for yourself

## 2017-01-11 ENCOUNTER — Telehealth: Payer: Self-pay | Admitting: Adult Health

## 2017-01-11 NOTE — Telephone Encounter (Signed)
Logan Larson (DPR) calling to check to see when or if the referral for the colonoscopy has been done stated it was on the AVS that someone will call to schedule that or what should they do.  She was advised to check with the insurance to see if a referral is needed.

## 2017-01-13 NOTE — Telephone Encounter (Signed)
Patient has CPE exam tomorrow 01/14/17 - Per Kandee Keenory, he will discuss with patient at office visit and put in order for colonoscopy then.

## 2017-01-14 ENCOUNTER — Encounter (INDEPENDENT_AMBULATORY_CARE_PROVIDER_SITE_OTHER): Payer: Self-pay

## 2017-01-14 ENCOUNTER — Ambulatory Visit (INDEPENDENT_AMBULATORY_CARE_PROVIDER_SITE_OTHER): Payer: BLUE CROSS/BLUE SHIELD | Admitting: Adult Health

## 2017-01-14 ENCOUNTER — Encounter: Payer: Self-pay | Admitting: Adult Health

## 2017-01-14 VITALS — BP 142/88 | Temp 97.9°F | Ht 72.5 in | Wt 142.8 lb

## 2017-01-14 DIAGNOSIS — Z1211 Encounter for screening for malignant neoplasm of colon: Secondary | ICD-10-CM | POA: Diagnosis not present

## 2017-01-14 DIAGNOSIS — Z1159 Encounter for screening for other viral diseases: Secondary | ICD-10-CM

## 2017-01-14 DIAGNOSIS — Z0000 Encounter for general adult medical examination without abnormal findings: Secondary | ICD-10-CM | POA: Diagnosis not present

## 2017-01-14 DIAGNOSIS — I10 Essential (primary) hypertension: Secondary | ICD-10-CM

## 2017-01-14 LAB — CBC WITH DIFFERENTIAL/PLATELET
BASOS ABS: 0 10*3/uL (ref 0.0–0.1)
Basophils Relative: 0.4 % (ref 0.0–3.0)
EOS ABS: 0.1 10*3/uL (ref 0.0–0.7)
Eosinophils Relative: 2 % (ref 0.0–5.0)
HCT: 44.3 % (ref 39.0–52.0)
Hemoglobin: 14.6 g/dL (ref 13.0–17.0)
LYMPHS ABS: 0.9 10*3/uL (ref 0.7–4.0)
Lymphocytes Relative: 16.7 % (ref 12.0–46.0)
MCHC: 33 g/dL (ref 30.0–36.0)
MCV: 90.8 fl (ref 78.0–100.0)
MONO ABS: 0.3 10*3/uL (ref 0.1–1.0)
Monocytes Relative: 5.8 % (ref 3.0–12.0)
NEUTROS PCT: 75.1 % (ref 43.0–77.0)
Neutro Abs: 4.1 10*3/uL (ref 1.4–7.7)
Platelets: 168 10*3/uL (ref 150.0–400.0)
RBC: 4.88 Mil/uL (ref 4.22–5.81)
RDW: 14.3 % (ref 11.5–15.5)
WBC: 5.5 10*3/uL (ref 4.0–10.5)

## 2017-01-14 LAB — LIPID PANEL
CHOL/HDL RATIO: 2
Cholesterol: 157 mg/dL (ref 0–200)
HDL: 72.5 mg/dL (ref >39.00)
LDL Cholesterol: 76 mg/dL (ref 0–99)
NONHDL: 84.21
TRIGLYCERIDES: 39 mg/dL (ref 0.0–149.0)
VLDL: 7.8 mg/dL (ref 0.0–40.0)

## 2017-01-14 LAB — BASIC METABOLIC PANEL
BUN: 17 mg/dL (ref 6–23)
CALCIUM: 9 mg/dL (ref 8.4–10.5)
CO2: 30 mEq/L (ref 19–32)
CREATININE: 1.08 mg/dL (ref 0.40–1.50)
Chloride: 108 mEq/L (ref 96–112)
GFR: 91.57 mL/min (ref >60.00)
GLUCOSE: 76 mg/dL (ref 70–99)
Potassium: 4.1 mEq/L (ref 3.5–5.1)
SODIUM: 144 meq/L (ref 135–145)

## 2017-01-14 LAB — PSA: PSA: 0.46 ng/mL (ref 0.10–4.00)

## 2017-01-14 LAB — HEPATIC FUNCTION PANEL
ALK PHOS: 69 U/L (ref 39–117)
ALT: 11 U/L (ref 0–53)
AST: 24 U/L (ref 0–37)
Albumin: 4.1 g/dL (ref 3.5–5.2)
BILIRUBIN DIRECT: 0.1 mg/dL (ref 0.0–0.3)
Total Bilirubin: 0.4 mg/dL (ref 0.2–1.2)
Total Protein: 7.2 g/dL (ref 6.0–8.3)

## 2017-01-14 LAB — TSH: TSH: 1.62 u[IU]/mL (ref 0.35–4.50)

## 2017-01-14 LAB — HEMOGLOBIN A1C: Hgb A1c MFr Bld: 5.4 % (ref 4.6–6.5)

## 2017-01-14 MED ORDER — LISINOPRIL 5 MG PO TABS: 5.0000 mg | ORAL_TABLET | Freq: Every day | ORAL | 3 refills | Status: DC

## 2017-01-14 NOTE — Patient Instructions (Signed)
Baby Boomer,   It was great seeing you today  I will call you about your blood work   I have sent in lisinopril 5 mg to the pharmacy. Take this daily. Follow up with me in one month   Someone will call you to schedule your colonoscopy

## 2017-01-14 NOTE — Progress Notes (Addendum)
Subjective:    Patient ID: KODAH MARET, male    DOB: [birth year], 56 y.o.   MRN: [id number]  HPI  Patient presents for yearly preventative medicine examination. He is a pleasant 56 year old male who  has a past medical history of Frequent headaches.  All immunizations and health maintenance protocols were reviewed with the patient and needed orders were placed.  Appropriate screening laboratory values were ordered for the patient including screening of hyperlipidemia, renal function and hepatic function. If indicated by BPH, a PSA was ordered.  Medication reconciliation,  past medical history, social history, problem list and allergies were reviewed in detail with the patient  Goals were established with regard to weight loss, exercise, and diet in compliance with medications  He is due for his colonoscopy.   He has no acute complaints.   Review of Systems  Constitutional: Negative.   HENT: Negative.   Eyes: Negative.   Respiratory: Negative.   Cardiovascular: Negative.   Gastrointestinal: Negative.   Endocrine: Negative.   Genitourinary: Negative.   Musculoskeletal: Negative.   Skin: Negative.   Allergic/Immunologic: Negative.   Neurological: Negative.   Hematological: Negative.   Psychiatric/Behavioral: Negative.   All other systems reviewed and are negative.  Past Medical History:  Diagnosis Date  . Frequent headaches     Social History   Social History  . Marital status: Single    Spouse name: N/A  . Number of children: N/A  . Years of education: N/A   Occupational History  . Not on file.   Social History Main Topics  . Smoking status: Never Smoker  . Smokeless tobacco: Never Used  . Alcohol use No  . Drug use: No  . Sexual activity: Yes   Other Topics Concern  . Not on file   Social History Narrative  . No narrative on file    Past Surgical History:  Procedure Laterality Date  . KNEE SURGERY Right 2015    Family History  Problem  Relation Age of Onset  . Heart disease Mother   . Hypertension Mother   . Diabetes Mother     No Known Allergies  No current outpatient prescriptions on file prior to visit.   No current facility-administered medications on file prior to visit.     BP (!) 142/88 (BP Location: Left Arm, Patient Position: Sitting, Cuff Size: Normal)   Temp 97.9 F (36.6 C) (Oral)   Ht 6' 0.5" (1.842 m)   Wt 142 lb 12.8 oz (64.8 kg)   BMI 19.10 kg/m       Objective:   Physical Exam  Constitutional: He is oriented to person, place, and time. He appears well-developed and well-nourished. No distress.  HENT:  Head: Normocephalic and atraumatic.  Right Ear: External ear normal.  Left Ear: External ear normal.  Nose: Nose normal.  Mouth/Throat: Oropharynx is clear and moist. No oropharyngeal exudate.  Eyes: Conjunctivae and EOM are normal. Pupils are equal, round, and reactive to light. Right eye exhibits no discharge. Left eye exhibits no discharge. No scleral icterus.  Neck: Normal range of motion. Neck supple. No JVD present. No tracheal deviation present. No thyromegaly present.  Cardiovascular: Normal rate, regular rhythm, normal heart sounds and intact distal pulses.  Exam reveals no gallop and no friction rub.   No murmur heard. Pulmonary/Chest: Effort normal and breath sounds normal. No stridor. No respiratory distress. He has no wheezes. He has no rales. He exhibits no tenderness.  Abdominal: Soft. Bowel sounds  are normal. He exhibits no distension and no mass. There is no tenderness. There is no rebound and no guarding.  Genitourinary: Rectum normal and prostate normal. No penile tenderness.  Musculoskeletal: Normal range of motion. He exhibits no edema, tenderness or deformity.  Lymphadenopathy:    He has no cervical adenopathy.  Neurological: He is alert and oriented to person, place, and time. He has normal reflexes. He displays normal reflexes. No cranial nerve deficit. He exhibits  normal muscle tone. Coordination normal.  Skin: Skin is warm and dry. No rash noted. He is not diaphoretic. No erythema. No pallor.  Psychiatric: He has a normal mood and affect. His behavior is normal. Judgment and thought content normal.  Nursing note and vitals reviewed.      Assessment & Plan:  1. Routine general medical examination at a health care facility - Basic metabolic panel - CBC with Differential/Platelet - Hemoglobin A1c - Hepatic function panel - Lipid panel - PSA - TSH  2. Colon cancer screening  - Ambulatory referral to Gastroenterology  3. Need for hepatitis C screening test  - Hep C Antibody  4. Essential hypertension - Will start on lisinopril 5 mg  - Follow up in one month - Basic metabolic panel - CBC with Differential/Platelet - Hemoglobin A1c - Hepatic function panel - Lipid panel - PSA - TSH - lisinopril (PRINIVIL,ZESTRIL) 5 MG tablet; Take 1 tablet (5 mg total) by mouth daily.  Dispense: 30 tablet; Refill: 3  Shirline Freesory Michae Grimley, NP

## 2017-01-15 LAB — HEPATITIS C ANTIBODY: HCV Ab: NEGATIVE

## 2017-04-18 ENCOUNTER — Encounter: Payer: Self-pay | Admitting: Adult Health

## 2018-08-09 ENCOUNTER — Ambulatory Visit: Payer: Self-pay

## 2018-08-09 NOTE — Telephone Encounter (Signed)
Pt wife called to state patient was having a dental procedure and his BP was high 175/105. Pt and wife deny hx of hypertension.  Medication record has the patient taking lisinopril daily. Pt and wife state that they were not aware. Pt was place on this medication at last office visit with Shirline Freesory Nafziger 01/14/17. Pt denies chest pain, weakness, headache, blurred vision. Appointment scheduled per protocol.  Care advice read to patient and his wife. Wife verbalized understanding of all instructions.  Reason for Disposition . Systolic BP  >= 180 OR Diastolic >= 110  Answer Assessment - Initial Assessment Questions 1. BLOOD PRESSURE: "What is the blood pressure?" "Did you take at least two measurements 5 minutes apart?"     174/105 0947 2. ONSET: "When did you take your blood pressure?"     Today at dentist 3. HOW: "How did you obtain the blood pressure?" (e.g., visiting nurse, automatic home BP monitor)     nurse 4. HISTORY: "Do you have a history of high blood pressure?"     No 5. MEDICATIONS: "Are you taking any medications for blood pressure?" "Have you missed any doses recently?"     No pt states he has never taken BP Medication Med list  6. OTHER SYMPTOMS: "Do you have any symptoms?" (e.g., headache, chest pain, blurred vision, difficulty breathing, weakness)     no 7. PREGNANCY: "Is there any chance you are pregnant?" "When was your last menstrual period?"     No  Protocols used: HIGH BLOOD PRESSURE-A-AH

## 2018-08-09 NOTE — Telephone Encounter (Signed)
Will send as FYI 

## 2018-08-10 ENCOUNTER — Encounter: Payer: Self-pay | Admitting: Family Medicine

## 2018-08-10 ENCOUNTER — Ambulatory Visit: Payer: BLUE CROSS/BLUE SHIELD | Admitting: Family Medicine

## 2018-08-10 VITALS — BP 140/94 | HR 57 | Temp 98.0°F | Wt 147.1 lb

## 2018-08-10 DIAGNOSIS — I10 Essential (primary) hypertension: Secondary | ICD-10-CM | POA: Diagnosis not present

## 2018-08-10 MED ORDER — LISINOPRIL 10 MG PO TABS: 10.0000 mg | ORAL_TABLET | Freq: Every day | ORAL | 0 refills | Status: DC

## 2018-08-10 NOTE — Progress Notes (Signed)
   Subjective:    Patient ID: Logan Larson, male    DOB: 1960/11/13, 57 y.o.   MRN: [id number]  HPI Here to check his BP and for clearance for dental work. He recently saw his dentist, Dr. Angela BurkeLuis Benitez, for some dental issues and they want to do some more extensive repair work. The day he was there his BP was 175/105, and he was told to see us about that. He has felt fine. He does not check his BP at home.    Review of Systems  Constitutional: Negative.   Respiratory: Negative.   Cardiovascular: Negative.   Neurological: Negative.        Objective:   Physical Exam Constitutional:      Appearance: Normal appearance.  Cardiovascular:     Rate and Rhythm: Normal rate and regular rhythm.     Pulses: Normal pulses.     Heart sounds: Normal heart sounds.  Pulmonary:     Effort: Pulmonary effort is normal.     Breath sounds: Normal breath sounds.  Musculoskeletal:        General: No swelling.  Neurological:     General: No focal deficit present.     Mental Status: He is alert and oriented to person, place, and time.           Assessment & Plan:  His HTN is poorly controlled. We will increase the Lisinopril to 10 mg daily. He is cleared for the dental procedure. He will follow up with Shirline Freesory Nafziger, his PCP, in a few weeks for a complete exam with labs.  Gershon CraneStephen Keonna Raether, MD

## 2018-09-01 ENCOUNTER — Other Ambulatory Visit: Payer: Self-pay | Admitting: Family Medicine

## 2018-09-27 ENCOUNTER — Encounter: Payer: Self-pay | Admitting: Gastroenterology

## 2018-09-27 ENCOUNTER — Ambulatory Visit (INDEPENDENT_AMBULATORY_CARE_PROVIDER_SITE_OTHER): Payer: BLUE CROSS/BLUE SHIELD | Admitting: Adult Health

## 2018-09-27 ENCOUNTER — Encounter: Payer: Self-pay | Admitting: Adult Health

## 2018-09-27 VITALS — BP 148/92 | Temp 98.0°F | Ht 72.0 in | Wt 148.0 lb

## 2018-09-27 DIAGNOSIS — Z1211 Encounter for screening for malignant neoplasm of colon: Secondary | ICD-10-CM | POA: Diagnosis not present

## 2018-09-27 DIAGNOSIS — I10 Essential (primary) hypertension: Secondary | ICD-10-CM

## 2018-09-27 DIAGNOSIS — Z0000 Encounter for general adult medical examination without abnormal findings: Secondary | ICD-10-CM | POA: Diagnosis not present

## 2018-09-27 DIAGNOSIS — Z125 Encounter for screening for malignant neoplasm of prostate: Secondary | ICD-10-CM

## 2018-09-27 LAB — COMPREHENSIVE METABOLIC PANEL
ALBUMIN: 4 g/dL (ref 3.5–5.2)
ALT: 8 U/L (ref 0–53)
AST: 16 U/L (ref 0–37)
Alkaline Phosphatase: 66 U/L (ref 39–117)
BUN: 16 mg/dL (ref 6–23)
CO2: 32 mEq/L (ref 19–32)
Calcium: 8.8 mg/dL (ref 8.4–10.5)
Chloride: 108 mEq/L (ref 96–112)
Creatinine, Ser: 1.12 mg/dL (ref 0.40–1.50)
GFR: 82.09 mL/min (ref >60.00)
Glucose, Bld: 92 mg/dL (ref 70–99)
Potassium: 4.4 mEq/L (ref 3.5–5.1)
Sodium: 145 mEq/L (ref 135–145)
Total Bilirubin: 0.4 mg/dL (ref 0.2–1.2)
Total Protein: 6.9 g/dL (ref 6.0–8.3)

## 2018-09-27 LAB — CBC WITH DIFFERENTIAL/PLATELET
BASOS PCT: 0.3 % (ref 0.0–3.0)
Basophils Absolute: 0 10*3/uL (ref 0.0–0.1)
Eosinophils Absolute: 0 10*3/uL (ref 0.0–0.7)
Eosinophils Relative: 0.8 % (ref 0.0–5.0)
HCT: 44.7 % (ref 39.0–52.0)
Hemoglobin: 15 g/dL (ref 13.0–17.0)
Lymphocytes Relative: 21.4 % (ref 12.0–46.0)
Lymphs Abs: 1 10*3/uL (ref 0.7–4.0)
MCHC: 33.6 g/dL (ref 30.0–36.0)
MCV: 90.5 fl (ref 78.0–100.0)
Monocytes Absolute: 0.2 10*3/uL (ref 0.1–1.0)
Monocytes Relative: 5.3 % (ref 3.0–12.0)
Neutro Abs: 3.3 10*3/uL (ref 1.4–7.7)
Neutrophils Relative %: 72.2 % (ref 43.0–77.0)
Platelets: 154 10*3/uL (ref 150.0–400.0)
RBC: 4.93 Mil/uL (ref 4.22–5.81)
RDW: 14.1 % (ref 11.5–15.5)
WBC: 4.6 10*3/uL (ref 4.0–10.5)

## 2018-09-27 LAB — TSH: TSH: 1.53 u[IU]/mL (ref 0.35–4.50)

## 2018-09-27 LAB — LIPID PANEL
Cholesterol: 153 mg/dL (ref 0–200)
HDL: 54 mg/dL (ref >39.00)
LDL Cholesterol: 89 mg/dL (ref 0–99)
NonHDL: 99.22
Total CHOL/HDL Ratio: 3
Triglycerides: 52 mg/dL (ref 0.0–149.0)
VLDL: 10.4 mg/dL (ref 0.0–40.0)

## 2018-09-27 LAB — PSA: PSA: 0.39 ng/mL (ref 0.10–4.00)

## 2018-09-27 NOTE — Progress Notes (Signed)
Subjective:    Patient ID: Logan Larson, male    DOB: 27-Feb-1961, 58 y.o.   MRN: [id number]  HPI Patient presents for yearly preventative medicine examination. He is a pleasant 58 year old male who  has a past medical history of Frequent headaches.   Essential Hypertension - was seen by another provider in December at which time lisinopril was increase from 5mg  to 10 mg for uncontrolled hypertension. He has not been checking his blood pressure on a regular basis nor has he been taking his medication. He denies headaches or blurred vision  BP Readings from Last 3 Encounters:  09/27/18 (!) 148/92  08/10/18 (!) 140/94  01/14/17 (!) 142/88    All immunizations and health maintenance protocols were reviewed with the patient and needed orders were placed.  Refused influenza vaccination  Appropriate screening laboratory values were ordered for the patient including screening of hyperlipidemia, renal function and hepatic function. If indicated by BPH, a PSA was ordered.  Medication reconciliation,  past medical history, social history, problem list and allergies were reviewed in detail with the patient  Goals were established with regard to weight loss, exercise, and  diet in compliance with medications  He is overdue for a colonoscopy, order was placed last year but he never scheduled. He has been by his dentist but not eye doctor.    Review of Systems  Constitutional: Negative.   HENT: Negative.   Eyes: Negative.   Respiratory: Negative.   Cardiovascular: Negative.   Gastrointestinal: Negative.   Endocrine: Negative.   Genitourinary: Negative.   Musculoskeletal: Negative.   Skin: Negative.   Allergic/Immunologic: Negative.   Neurological: Negative.   Hematological: Negative.   Psychiatric/Behavioral: Negative.   All other systems reviewed and are negative.  Past Medical History:  Diagnosis Date  . Essential hypertension   . Frequent headaches     Social History    Socioeconomic History  . Marital status: Single    Spouse name: Not on file  . Number of children: Not on file  . Years of education: Not on file  . Highest education level: Not on file  Occupational History  . Not on file  Social Needs  . Financial resource strain: Not on file  . Food insecurity:    Worry: Not on file    Inability: Not on file  . Transportation needs:    Medical: Not on file    Non-medical: Not on file  Tobacco Use  . Smoking status: Never Smoker  . Smokeless tobacco: Never Used  Substance and Sexual Activity  . Alcohol use: No  . Drug use: No  . Sexual activity: Yes  Lifestyle  . Physical activity:    Days per week: Not on file    Minutes per session: Not on file  . Stress: Not on file  Relationships  . Social connections:    Talks on phone: Not on file    Gets together: Not on file    Attends religious service: Not on file    Active member of club or organization: Not on file    Attends meetings of clubs or organizations: Not on file    Relationship status: Not on file  . Intimate partner violence:    Fear of current or ex partner: Not on file    Emotionally abused: Not on file    Physically abused: Not on file    Forced sexual activity: Not on file  Other Topics Concern  . Not on  file  Social History Narrative  . Not on file    Past Surgical History:  Procedure Laterality Date  . KNEE SURGERY Right 2015    Family History  Problem Relation Age of Onset  . Heart disease Mother   . Hypertension Mother   . Diabetes Mother     No Known Allergies  Current Outpatient Medications on File Prior to Visit  Medication Sig Dispense Refill  . lisinopril (PRINIVIL,ZESTRIL) 10 MG tablet TAKE 1 TABLET BY MOUTH EVERY DAY (Patient not taking: Reported on 09/27/2018) 30 tablet 6   No current facility-administered medications on file prior to visit.     BP (!) 148/92   Temp 98 F (36.7 C)   Ht 6' (1.829 m)   Wt 148 lb (67.1 kg)   BMI 20.07  kg/m       Objective:   Physical Exam Vitals signs and nursing note reviewed.  Constitutional:      General: He is not in acute distress.    Appearance: Normal appearance. He is not diaphoretic.  HENT:     Head: Normocephalic and atraumatic.     Right Ear: Tympanic membrane, ear canal and external ear normal. There is no impacted cerumen.     Left Ear: Tympanic membrane, ear canal and external ear normal. There is no impacted cerumen.     Nose: Nose normal. No congestion or rhinorrhea.     Mouth/Throat:     Mouth: Mucous membranes are moist.     Dentition: Abnormal dentition.     Pharynx: Oropharynx is clear. No oropharyngeal exudate or posterior oropharyngeal erythema.  Eyes:     General: No scleral icterus.       Right eye: No discharge.        Left eye: No discharge.     Conjunctiva/sclera: Conjunctivae normal.     Pupils: Pupils are equal, round, and reactive to light.  Neck:     Musculoskeletal: Normal range of motion and neck supple. No neck rigidity or muscular tenderness.     Thyroid: No thyromegaly.     Vascular: No carotid bruit or JVD.     Trachea: No tracheal deviation.  Cardiovascular:     Rate and Rhythm: Normal rate and regular rhythm.     Pulses: Normal pulses.     Heart sounds: Normal heart sounds. No murmur. No friction rub. No gallop.   Pulmonary:     Effort: Pulmonary effort is normal. No respiratory distress.     Breath sounds: Normal breath sounds. No stridor. No wheezing or rales.  Chest:     Chest wall: No tenderness.  Abdominal:     General: Bowel sounds are normal. There is no distension.     Palpations: Abdomen is soft. There is no mass.     Tenderness: There is no abdominal tenderness. There is no right CVA tenderness, left CVA tenderness, guarding or rebound.     Hernia: No hernia is present.  Genitourinary:    Comments: Will do PSA Musculoskeletal: Normal range of motion.        General: No swelling, tenderness, deformity or signs of  injury.     Left lower leg: No edema.  Lymphadenopathy:     Cervical: No cervical adenopathy.  Skin:    General: Skin is warm and dry.     Capillary Refill: Capillary refill takes less than 2 seconds.     Coloration: Skin is not jaundiced or pale.     Findings: No bruising, erythema,  lesion or rash.  Neurological:     General: No focal deficit present.     Mental Status: He is alert and oriented to person, place, and time.     Cranial Nerves: No cranial nerve deficit.     Sensory: No sensory deficit.     Motor: No abnormal muscle tone.     Coordination: Coordination normal.     Gait: Gait normal.     Deep Tendon Reflexes: Reflexes are normal and symmetric. Reflexes normal.  Psychiatric:        Mood and Affect: Mood normal.        Behavior: Behavior normal.        Thought Content: Thought content normal.        Judgment: Judgment normal.       Assessment & Plan:  1. Routine general medical examination at a health care facility -Encouraged to stay active and eat a heart healthy diet.  Follow-up in 1 year for next physical exam or sooner if needed - CBC with Differential/Platelet - Comprehensive metabolic panel - Lipid panel - TSH  2. Essential hypertension -Educated on the importance of taking medications as directed.  I will have him take his lisinopril 10 mg Dr. his blood pressure at home morning and evening.  He will follow-up in 2 weeks for reevaluation - Ambulatory referral to Gastroenterology - CBC with Differential/Platelet - Comprehensive metabolic panel - Lipid panel - TSH  3. Prostate cancer screening  - PSA  4. Colon cancer screening  - Ambulatory referral to Gastroenterology  Shirline Freesory Casimir Barcellos, NP

## 2018-09-27 NOTE — Patient Instructions (Signed)
It was great seeing you today   I want you to take your blood pressure medication every morning for the next two weeks, write down your blood pressure readings every day and night and bring the readings back in two weeks.   We will follow up with you regarding your blood work

## 2018-09-28 ENCOUNTER — Encounter: Payer: Self-pay | Admitting: Family Medicine

## 2018-10-11 ENCOUNTER — Encounter: Payer: Self-pay | Admitting: Adult Health

## 2018-10-11 ENCOUNTER — Ambulatory Visit (AMBULATORY_SURGERY_CENTER): Payer: Self-pay | Admitting: *Deleted

## 2018-10-11 ENCOUNTER — Ambulatory Visit: Payer: BLUE CROSS/BLUE SHIELD | Admitting: Adult Health

## 2018-10-11 ENCOUNTER — Encounter: Payer: Self-pay | Admitting: Gastroenterology

## 2018-10-11 VITALS — BP 134/96 | Temp 97.8°F | Wt 151.0 lb

## 2018-10-11 VITALS — Ht 69.0 in | Wt 151.0 lb

## 2018-10-11 DIAGNOSIS — I10 Essential (primary) hypertension: Secondary | ICD-10-CM

## 2018-10-11 DIAGNOSIS — Z1211 Encounter for screening for malignant neoplasm of colon: Secondary | ICD-10-CM

## 2018-10-11 MED ORDER — NA SULFATE-K SULFATE-MG SULF 17.5-3.13-1.6 GM/177ML PO SOLN: 1.0000 | Freq: Once | ORAL | 0 refills | Status: AC

## 2018-10-11 MED ORDER — LISINOPRIL 20 MG PO TABS: 20.0000 mg | ORAL_TABLET | Freq: Every day | ORAL | 3 refills | Status: DC

## 2018-10-11 NOTE — Progress Notes (Signed)
Subjective:    Patient ID: Logan Larson, male    DOB: 11-15-1960, 58 y.o.   MRN: [id number]  HPI  58 year old male who  has a past medical history of Essential hypertension and Frequent headaches. To the office today for 2-week follow-up regarding hypertension.  During the last visit blood pressures were elevated, he was not taking his prescribed medication or monitoring his blood pressures at home.  He was advised to do both daily and bring log to his appointment in 2 weeks.  Today in the office he reports that he has been taking his blood pressure medication daily and has been monitoring his BP twice a day. Per his log his BP has been between 119-150/80-100. He denies dry cough, lightheaded and dizziness   Review of Systems See HPI   Past Medical History:  Diagnosis Date  . Essential hypertension   . Frequent headaches     Social History   Socioeconomic History  . Marital status: Single    Spouse name: Not on file  . Number of children: Not on file  . Years of education: Not on file  . Highest education level: Not on file  Occupational History  . Not on file  Social Needs  . Financial resource strain: Not on file  . Food insecurity:    Worry: Not on file    Inability: Not on file  . Transportation needs:    Medical: Not on file    Non-medical: Not on file  Tobacco Use  . Smoking status: Never Smoker  . Smokeless tobacco: Never Used  Substance and Sexual Activity  . Alcohol use: No  . Drug use: No  . Sexual activity: Yes  Lifestyle  . Physical activity:    Days per week: Not on file    Minutes per session: Not on file  . Stress: Not on file  Relationships  . Social connections:    Talks on phone: Not on file    Gets together: Not on file    Attends religious service: Not on file    Active member of club or organization: Not on file    Attends meetings of clubs or organizations: Not on file    Relationship status: Not on file  . Intimate partner violence:      Fear of current or ex partner: Not on file    Emotionally abused: Not on file    Physically abused: Not on file    Forced sexual activity: Not on file  Other Topics Concern  . Not on file  Social History Narrative  . Not on file    Past Surgical History:  Procedure Laterality Date  . KNEE SURGERY Right 2015    Family History  Problem Relation Age of Onset  . Heart disease Mother   . Hypertension Mother   . Diabetes Mother     No Known Allergies  Current Outpatient Medications on File Prior to Visit  Medication Sig Dispense Refill  . lisinopril (PRINIVIL,ZESTRIL) 10 MG tablet TAKE 1 TABLET BY MOUTH EVERY DAY (Patient not taking: Reported on 09/27/2018) 30 tablet 6   No current facility-administered medications on file prior to visit.     There were no vitals taken for this visit.      Objective:   Physical Exam Vitals signs and nursing note reviewed.  Constitutional:      Appearance: Normal appearance.  Cardiovascular:     Rate and Rhythm: Normal rate and regular rhythm.  Pulses: Normal pulses.     Heart sounds: Normal heart sounds.  Pulmonary:     Effort: Pulmonary effort is normal.     Breath sounds: Normal breath sounds.  Musculoskeletal: Normal range of motion.  Skin:    General: Skin is warm and dry.  Neurological:     General: No focal deficit present.     Mental Status: He is alert. Mental status is at baseline.  Psychiatric:        Mood and Affect: Mood normal.        Behavior: Behavior normal.        Thought Content: Thought content normal.        Judgment: Judgment normal.        Assessment & Plan:  1. Essential hypertension - Will increase lisinopril from 10 mg to 20 mg  - lisinopril (PRINIVIL,ZESTRIL) 20 MG tablet; Take 1 tablet (20 mg total) by mouth daily.  Dispense: 90 tablet; Refill: 3 - Continue to monitor BP at home . - Return precautions reviewed  Shirline Frees

## 2018-10-11 NOTE — Progress Notes (Signed)
No egg or soy allergy known to patient  No issues with past sedation with any surgeries  or procedures, no intubation problems  No diet pills per patient No home 02 use per patient  No blood thinners per patient  Pt denies issues with constipation  No A fib or A flutter  EMMI video sent to pt's e mail  --   Suprep PNM  $50 coupon to pt

## 2018-10-25 ENCOUNTER — Encounter: Payer: Self-pay | Admitting: Gastroenterology

## 2018-10-25 ENCOUNTER — Other Ambulatory Visit: Payer: Self-pay

## 2018-10-25 ENCOUNTER — Ambulatory Visit (AMBULATORY_SURGERY_CENTER): Payer: BLUE CROSS/BLUE SHIELD | Admitting: Gastroenterology

## 2018-10-25 VITALS — BP 103/71 | HR 57 | Temp 96.8°F | Resp 16 | Ht 69.0 in | Wt 151.0 lb

## 2018-10-25 DIAGNOSIS — Z1211 Encounter for screening for malignant neoplasm of colon: Secondary | ICD-10-CM

## 2018-10-25 MED ORDER — SODIUM CHLORIDE 0.9 % IV SOLN: 500.0000 mL | INTRAVENOUS | Status: DC

## 2018-10-25 NOTE — Patient Instructions (Signed)
Continue present medications    YOU HAD AN ENDOSCOPIC PROCEDURE TODAY AT THE [facility]:   Refer to the procedure report that was given to you for any specific questions about what was found during the examination.  If the procedure report does not answer your questions, please call your gastroenterologist to clarify.  If you requested that your care partner not be given the details of your procedure findings, then the procedure report has been included in a sealed envelope for you to review at your convenience later.  YOU SHOULD EXPECT: Some feelings of bloating in the abdomen. Passage of more gas than usual.  Walking can help get rid of the air that was put into your GI tract during the procedure and reduce the bloating. If you had a lower endoscopy (such as a colonoscopy or flexible sigmoidoscopy) you may notice spotting of blood in your stool or on the toilet paper. If you underwent a bowel prep for your procedure, you may not have a normal bowel movement for a few days.  Please Note:  You might notice some irritation and congestion in your nose or some drainage.  This is from the oxygen used during your procedure.  There is no need for concern and it should clear up in a day or so.  SYMPTOMS TO REPORT IMMEDIATELY:   Following lower endoscopy (colonoscopy or flexible sigmoidoscopy):  Excessive amounts of blood in the stool  Significant tenderness or worsening of abdominal pains  Swelling of the abdomen that is new, acute  Fever of 100F or higher   For urgent or emergent issues, a gastroenterologist can be reached at any hour by calling [phone].   DIET:  We do recommend a small meal at first, but then you may proceed to your regular diet.  Drink plenty of fluids but you should avoid alcoholic beverages for 24 hours.  ACTIVITY:  You should plan to take it easy for the rest of today and you should NOT DRIVE or use heavy machinery until tomorrow (because of the sedation  medicines used during the test).    FOLLOW UP: Our staff will call the number listed on your records the next business day following your procedure to check on you and address any questions or concerns that you may have regarding the information given to you following your procedure. If we do not reach you, we will leave a message.  However, if you are feeling well and you are not experiencing any problems, there is no need to return our call.  We will assume that you have returned to your regular daily activities without incident.  If any biopsies were taken you will be contacted by phone or by letter within the next 1-3 weeks.  Please call us at [phone] if you have not heard about the biopsies in 3 weeks.    SIGNATURES/CONFIDENTIALITY: You and/or your care partner have signed paperwork which will be entered into your electronic medical record.  These signatures attest to the fact that that the information above on your After Visit Summary has been reviewed and is understood.  Full responsibility of the confidentiality of this discharge information lies with you and/or your care-partner. 

## 2018-10-25 NOTE — Progress Notes (Signed)
Alert and oriented x 3, pleased with MAC, report to RN

## 2018-10-25 NOTE — Op Note (Signed)
[facility] Patient Name: Logan Larson Procedure Date: 10/25/2018 9:14 AM MRN: [id number] Endoscopist: Sherilyn Cooter L. Myrtie Neither , MD Age: 58 Referring MD:  Date of Birth: July 26, 1961 Gender: Male Account #: [id number] Procedure:                Colonoscopy Indications:              Screening for colorectal malignant neoplasm, This                            is the patient's first colonoscopy Medicines:                Monitored Anesthesia Care Procedure:                Pre-Anesthesia Assessment:                           - Prior to the procedure, a History and Physical                            was performed, and patient medications and                            allergies were reviewed. The patient's tolerance of                            previous anesthesia was also reviewed. The risks                            and benefits of the procedure and the sedation                            options and risks were discussed with the patient.                            All questions were answered, and informed consent                            was obtained. Prior Anticoagulants: The patient has                            taken no previous anticoagulant or antiplatelet                            agents. ASA Grade Assessment: I - A normal, healthy                            patient. After reviewing the risks and benefits,                            the patient was deemed in satisfactory condition to                            undergo the procedure.  After obtaining informed consent, the colonoscope                            was passed under direct vision. Throughout the                            procedure, the patient's blood pressure, pulse, and                            oxygen saturations were monitored continuously. The                            Colonoscope was introduced through the anus and                            advanced to the the cecum, identified by                             appendiceal orifice and ileocecal valve. The                            colonoscopy was performed without difficulty. The                            patient tolerated the procedure well. The quality                            of the bowel preparation was excellent. The                            ileocecal valve, appendiceal orifice, and rectum                            were photographed. Scope In: 9:18:32 AM Scope Out: 9:30:06 AM Scope Withdrawal Time: 0 hours 7 minutes 29 seconds  Total Procedure Duration: 0 hours 11 minutes 34 seconds  Findings:                 The perianal and digital rectal examinations were                            normal.                           The entire examined colon appeared normal.                            Retroflexion was not performed in the rectum due to                            anatomy. Complications:            No immediate complications. Estimated Blood Loss:     Estimated blood loss: none. Impression:               - The entire examined colon is normal.                           -  No specimens collected. Recommendation:           - Patient has a contact number available for                            emergencies. The signs and symptoms of potential                            delayed complications were discussed with the                            patient. Return to normal activities tomorrow.                            Written discharge instructions were provided to the                            patient.                           - Resume previous diet.                           - Continue present medications.                           - Repeat colonoscopy in 10 years for screening                            purposes. Henry L. Myrtie Neither, MD 10/25/2018 9:34:55 AM This report has been signed electronically.

## 2018-10-26 ENCOUNTER — Telehealth: Payer: Self-pay | Admitting: *Deleted

## 2018-10-26 NOTE — Telephone Encounter (Signed)
  Follow up Call-  Call back number 10/25/2018  Post procedure Call Back phone  # [phone]  Permission to leave phone message Yes  Some recent data might be hidden     Patient questions:  Do you have a fever, pain , or abdominal swelling? No. Pain Score  0 *  Have you tolerated food without any problems? Yes.    Have you been able to return to your normal activities? Yes.    Do you have any questions about your discharge instructions: Diet   No. Medications  No. Follow up visit  No.  Do you have questions or concerns about your Care? No.  Actions: * If pain score is 4 or above: No action needed, pain <4.

## 2019-05-15 ENCOUNTER — Ambulatory Visit: Payer: BLUE CROSS/BLUE SHIELD | Admitting: Adult Health

## 2019-05-29 ENCOUNTER — Other Ambulatory Visit: Payer: Self-pay

## 2019-05-29 ENCOUNTER — Ambulatory Visit: Payer: BLUE CROSS/BLUE SHIELD | Admitting: Adult Health

## 2019-05-29 ENCOUNTER — Encounter: Payer: Self-pay | Admitting: Adult Health

## 2019-05-29 VITALS — BP 140/90 | Temp 97.6°F | Wt 142.0 lb

## 2019-05-29 DIAGNOSIS — I10 Essential (primary) hypertension: Secondary | ICD-10-CM | POA: Diagnosis not present

## 2019-05-29 NOTE — Progress Notes (Signed)
Subjective:    Patient ID: Logan Larson, male    DOB: August 16, 1961, 58 y.o.   MRN: [id number]  HPI  58 year old male who  has a past medical history of Essential hypertension and Frequent headaches.  He presents to the office today for follow-up regarding hypertension.  Is currently maintained on lisinopril 20 mg daily.  He denies dizziness, lightheadedness, chest pain, or shortness of breath.  Has been monitoring his blood pressures at home and gets readings in the 110s through 140s over 80s to 90s.  He did not take his blood pressure medication this morning and he has not been taking it on a daily basis but does take it more times than not throughout the week  BP Readings from Last 3 Encounters:  05/29/19 140/90  10/25/18 103/71  10/11/18 (!) 134/96     Review of Systems See HPI   Past Medical History:  Diagnosis Date  . Essential hypertension   . Frequent headaches     Social History   Socioeconomic History  . Marital status: Single    Spouse name: Not on file  . Number of children: Not on file  . Years of education: Not on file  . Highest education level: Not on file  Occupational History  . Not on file  Social Needs  . Financial resource strain: Not on file  . Food insecurity    Worry: Not on file    Inability: Not on file  . Transportation needs    Medical: Not on file    Non-medical: Not on file  Tobacco Use  . Smoking status: Never Smoker  . Smokeless tobacco: Never Used  Substance and Sexual Activity  . Alcohol use: No  . Drug use: No  . Sexual activity: Yes  Lifestyle  . Physical activity    Days per week: Not on file    Minutes per session: Not on file  . Stress: Not on file  Relationships  . Social Musician on phone: Not on file    Gets together: Not on file    Attends religious service: Not on file    Active member of club or organization: Not on file    Attends meetings of clubs or organizations: Not on file    Relationship  status: Not on file  . Intimate partner violence    Fear of current or ex partner: Not on file    Emotionally abused: Not on file    Physically abused: Not on file    Forced sexual activity: Not on file  Other Topics Concern  . Not on file  Social History Narrative  . Not on file    Past Surgical History:  Procedure Laterality Date  . KNEE SURGERY Right 2015  . WISDOM TOOTH EXTRACTION      Family History  Problem Relation Age of Onset  . Heart disease Mother   . Hypertension Mother   . Diabetes Mother   . Colon cancer Neg Hx   . Colon polyps Neg Hx   . Esophageal cancer Neg Hx   . Rectal cancer Neg Hx   . Stomach cancer Neg Hx     No Known Allergies  Current Outpatient Medications on File Prior to Visit  Medication Sig Dispense Refill  . lisinopril (PRINIVIL,ZESTRIL) 20 MG tablet Take 1 tablet (20 mg total) by mouth daily. 90 tablet 3   No current facility-administered medications on file prior to visit.  BP 140/90 Comment: PT HAS NOT HAD MEDICATION TODAY  Temp 97.6 F (36.4 C) (Temporal)   Wt 142 lb (64.4 kg)   BMI 20.97 kg/m       Objective:   Physical Exam Vitals signs and nursing note reviewed.  Constitutional:      Appearance: Normal appearance.  Cardiovascular:     Rate and Rhythm: Normal rate and regular rhythm.     Pulses: Normal pulses.     Heart sounds: Normal heart sounds.  Pulmonary:     Effort: Pulmonary effort is normal.     Breath sounds: Normal breath sounds.  Skin:    General: Skin is warm and dry.     Capillary Refill: Capillary refill takes less than 2 seconds.  Neurological:     General: No focal deficit present.     Mental Status: He is alert and oriented to person, place, and time.  Psychiatric:        Mood and Affect: Mood normal.        Behavior: Behavior normal.        Thought Content: Thought content normal.        Judgment: Judgment normal.        Assessment & Plan:  1. Essential hypertension -Encouraged to  take blood pressure medication every day.  He does take it blood pressure seems to be better controlled.  No change in medication at this time.  Dorothyann Peng, NP

## 2019-11-15 ENCOUNTER — Ambulatory Visit: Payer: BLUE CROSS/BLUE SHIELD | Attending: Internal Medicine

## 2019-11-15 DIAGNOSIS — Z23 Encounter for immunization: Secondary | ICD-10-CM

## 2019-11-15 NOTE — Progress Notes (Signed)
   Covid-19 Vaccination Clinic  Name:  Logan Larson    MRN: [id number] DOB: 10-17-1960  11/15/2019  [name] was observed post Covid-19 immunization for 15 minutes without incident. He was provided with Vaccine Information Sheet and instruction to access the V-Safe system.   [name] was instructed to call 911 with any severe reactions post vaccine: Marland Kitchen Difficulty breathing  . Swelling of face and throat  . A fast heartbeat  . A bad rash all over body  . Dizziness and weakness   Immunizations Administered    Name Date Dose VIS Date Route   Moderna COVID-19 Vaccine 11/15/2019 12:58 PM 0.5 mL 07/24/2019 Intramuscular   Manufacturer: Moderna   Lot: [id number]   NDC: 88110-315-94

## 2019-12-18 ENCOUNTER — Ambulatory Visit: Payer: BLUE CROSS/BLUE SHIELD

## 2019-12-18 ENCOUNTER — Ambulatory Visit: Payer: BLUE CROSS/BLUE SHIELD | Attending: Internal Medicine

## 2019-12-18 DIAGNOSIS — Z23 Encounter for immunization: Secondary | ICD-10-CM

## 2019-12-18 NOTE — Progress Notes (Signed)
   Covid-19 Vaccination Clinic  Name:  Logan Larson    MRN: [id number] DOB: 24-Dec-1960  12/18/2019  [name] was observed post Covid-19 immunization for 15 minutes without incident. He was provided with Vaccine Information Sheet and instruction to access the V-Safe system.   [name] was instructed to call 911 with any severe reactions post vaccine: Marland Kitchen Difficulty breathing  . Swelling of face and throat  . A fast heartbeat  . A bad rash all over body  . Dizziness and weakness   Immunizations Administered    Name Date Dose VIS Date Route   Moderna COVID-19 Vaccine 12/18/2019 10:05 AM 0.5 mL 07/2019 Intramuscular   Manufacturer: Moderna   Lot: [id number]   NDC: 03546-568-12

## 2020-02-06 ENCOUNTER — Encounter: Payer: BLUE CROSS/BLUE SHIELD | Admitting: Adult Health

## 2020-03-13 ENCOUNTER — Telehealth: Payer: Self-pay | Admitting: Adult Health

## 2020-03-13 NOTE — Telephone Encounter (Signed)
LMVM for the patient to contact the office to reschedule his CPE for 03/14/2020 at 7AM from the waitilist

## 2020-03-14 ENCOUNTER — Other Ambulatory Visit: Payer: Self-pay | Admitting: Adult Health

## 2020-03-14 DIAGNOSIS — I10 Essential (primary) hypertension: Secondary | ICD-10-CM

## 2020-03-18 NOTE — Telephone Encounter (Signed)
Sent to the pharmacy by e-scribe for 90 days.  Pt has upcoming cpx. 

## 2020-03-26 ENCOUNTER — Ambulatory Visit (INDEPENDENT_AMBULATORY_CARE_PROVIDER_SITE_OTHER): Payer: BC Managed Care – PPO | Admitting: Adult Health

## 2020-03-26 ENCOUNTER — Encounter: Payer: Self-pay | Admitting: Adult Health

## 2020-03-26 ENCOUNTER — Other Ambulatory Visit: Payer: Self-pay

## 2020-03-26 VITALS — BP 140/88 | Temp 97.8°F | Ht 72.0 in | Wt 140.0 lb

## 2020-03-26 DIAGNOSIS — I10 Essential (primary) hypertension: Secondary | ICD-10-CM

## 2020-03-26 DIAGNOSIS — Z125 Encounter for screening for malignant neoplasm of prostate: Secondary | ICD-10-CM

## 2020-03-26 DIAGNOSIS — Z0000 Encounter for general adult medical examination without abnormal findings: Secondary | ICD-10-CM | POA: Diagnosis not present

## 2020-03-26 MED ORDER — AMLODIPINE BESYLATE 5 MG PO TABS
5.0000 mg | ORAL_TABLET | Freq: Every day | ORAL | 0 refills | Status: DC
Start: 2020-03-26 — End: 2020-04-09

## 2020-03-26 NOTE — Progress Notes (Signed)
Subjective:    Patient ID: Logan Larson, male    DOB: February 18, 1961, 59 y.o.   MRN: [id number]  HPI Patient presents for yearly preventative medicine examination. He is a pleasant 59 year old male who  has a past medical history of Essential hypertension and Frequent headaches.  Essential Hypertension -he is currently prescribed lisinopril 20 mg daily.  He does not check his blood pressure at home.  He denies dizziness, lightheadedness, chest pain, shortness of breath, or syncopal episodes. He does report a  Long standing dry cough with lisinopril has stopped the medication over the last 3 days and noticed improvement in his cough  BP Readings from Last 3 Encounters:  03/26/20 140/88  05/29/19 140/90  10/25/18 103/71   All immunizations and health maintenance protocols were reviewed with the patient and needed orders were placed.  Appropriate screening laboratory values were ordered for the patient including screening of hyperlipidemia, renal function and hepatic function. If indicated by BPH, a PSA was ordered.  Medication reconciliation,  past medical history, social history, problem list and allergies were reviewed in detail with the patient  Goals were established with regard to weight loss, exercise, and  diet in compliance with medications. He stays very active and eats healthy Wt Readings from Last 3 Encounters:  03/26/20 140 lb (63.5 kg)  05/29/19 142 lb (64.4 kg)  10/25/18 151 lb (68.5 kg)    He is up-to-date on routine colon cancer screening   Review of Systems  Constitutional: Negative.   HENT: Negative.   Eyes: Negative.   Respiratory: Negative.   Cardiovascular: Negative.   Gastrointestinal: Negative.   Endocrine: Negative.   Genitourinary: Negative.   Musculoskeletal: Negative.   Skin: Negative.   Allergic/Immunologic: Negative.   Neurological: Negative.   Hematological: Negative.   Psychiatric/Behavioral: Negative.   All other systems reviewed and are  negative.  Past Medical History:  Diagnosis Date   Essential hypertension    Frequent headaches     Social History   Socioeconomic History   Marital status: Single    Spouse name: Not on file   Number of children: Not on file   Years of education: Not on file   Highest education level: Not on file  Occupational History   Not on file  Tobacco Use   Smoking status: Never Smoker   Smokeless tobacco: Never Used  Substance and Sexual Activity   Alcohol use: No   Drug use: No   Sexual activity: Yes  Other Topics Concern   Not on file  Social History Narrative   Not on file   Social Determinants of Health   Financial Resource Strain:    Difficulty of Paying Living Expenses:   Food Insecurity:    Worried About Charity fundraiser in the Last Year:    Arboriculturist in the Last Year:   Transportation Needs:    Film/video editor (Medical):    Lack of Transportation (Non-Medical):   Physical Activity:    Days of Exercise per Week:    Minutes of Exercise per Session:   Stress:    Feeling of Stress :   Social Connections:    Frequency of Communication with Friends and Family:    Frequency of Social Gatherings with Friends and Family:    Attends Religious Services:    Active Member of Clubs or Organizations:    Attends Archivist Meetings:    Marital Status:   Intimate Partner Violence:  Fear of Current or Ex-Partner:    Emotionally Abused:    Physically Abused:    Sexually Abused:     Past Surgical History:  Procedure Laterality Date   KNEE SURGERY Right 2015   WISDOM TOOTH EXTRACTION      Family History  Problem Relation Age of Onset   Heart disease Mother    Hypertension Mother    Diabetes Mother    Colon cancer Neg Hx    Colon polyps Neg Hx    Esophageal cancer Neg Hx    Rectal cancer Neg Hx    Stomach cancer Neg Hx     No Known Allergies  Current Outpatient Medications on File Prior to  Visit  Medication Sig Dispense Refill   lisinopril (ZESTRIL) 20 MG tablet TAKE 1 TABLET BY MOUTH EVERY DAY 90 tablet 0   No current facility-administered medications on file prior to visit.    BP 140/88    Temp 97.8 F (36.6 C)    Ht 6' (1.829 m)    Wt 140 lb (63.5 kg)    BMI 18.99 kg/m       Objective:   Physical Exam Vitals and nursing note reviewed.  Constitutional:      General: He is not in acute distress.    Appearance: Normal appearance. He is well-developed and normal weight.  HENT:     Head: Normocephalic and atraumatic.     Right Ear: Tympanic membrane, ear canal and external ear normal. There is no impacted cerumen.     Left Ear: Tympanic membrane, ear canal and external ear normal. There is no impacted cerumen.     Nose: Nose normal. No congestion or rhinorrhea.     Mouth/Throat:     Mouth: Mucous membranes are moist.     Pharynx: Oropharynx is clear. No oropharyngeal exudate or posterior oropharyngeal erythema.  Eyes:     General:        Right eye: No discharge.        Left eye: No discharge.     Extraocular Movements: Extraocular movements intact.     Conjunctiva/sclera: Conjunctivae normal.     Pupils: Pupils are equal, round, and reactive to light.  Neck:     Vascular: No carotid bruit.     Trachea: No tracheal deviation.  Cardiovascular:     Rate and Rhythm: Normal rate and regular rhythm.     Pulses: Normal pulses.     Heart sounds: Normal heart sounds. No murmur heard.  No friction rub. No gallop.   Pulmonary:     Effort: Pulmonary effort is normal. No respiratory distress.     Breath sounds: Normal breath sounds. No stridor. No wheezing, rhonchi or rales.  Chest:     Chest wall: No tenderness.  Abdominal:     General: Bowel sounds are normal. There is no distension.     Palpations: Abdomen is soft. There is no mass.     Tenderness: There is no abdominal tenderness. There is no right CVA tenderness, left CVA tenderness, guarding or rebound.      Hernia: No hernia is present.  Musculoskeletal:        General: No swelling, tenderness, deformity or signs of injury. Normal range of motion.     Right lower leg: No edema.     Left lower leg: No edema.  Lymphadenopathy:     Cervical: No cervical adenopathy.  Skin:    General: Skin is warm and dry.     Capillary  Refill: Capillary refill takes less than 2 seconds.     Coloration: Skin is not jaundiced or pale.     Findings: No bruising, erythema, lesion or rash.  Neurological:     General: No focal deficit present.     Mental Status: He is alert and oriented to person, place, and time.     Cranial Nerves: No cranial nerve deficit.     Sensory: No sensory deficit.     Motor: No weakness.     Coordination: Coordination normal.     Gait: Gait normal.     Deep Tendon Reflexes: Reflexes normal.  Psychiatric:        Mood and Affect: Mood normal.        Behavior: Behavior normal.        Thought Content: Thought content normal.        Judgment: Judgment normal.       Assessment & Plan:  1. Routine general medical examination at a health care facility  - CBC with Differential/Platelet; Future - Lipid panel; Future - TSH; Future - CMP with eGFR([facility]); Future  2. Prostate cancer screening  - PSA; Future  3. Essential hypertension - Will switch from lisinopril to Norvasc - Follow up in 2 weeks  - CBC with Differential/Platelet; Future - Lipid panel; Future - TSH; Future - CMP with eGFR([facility]); Future   Dorothyann Peng, NP

## 2020-03-26 NOTE — Patient Instructions (Addendum)
It was great seeing you today   I am going to change your blood pressure medication to norvasc, you can stop Lisinopril   Please follow up with me in two weeks   We will follow up with you regarding your labs

## 2020-03-27 LAB — LIPID PANEL
Cholesterol: 165 mg/dL (ref <200)
HDL: 68 mg/dL (ref >=40)
LDL Cholesterol (Calc): 84 mg/dL (calc)
Non-HDL Cholesterol (Calc): 97 mg/dL (calc) (ref <130)
Total CHOL/HDL Ratio: 2.4 (calc) (ref <5.0)
Triglycerides: 51 mg/dL (ref <150)

## 2020-03-27 LAB — COMPLETE METABOLIC PANEL WITH GFR
AG Ratio: 1.3 (calc) (ref 1.0–2.5)
ALT: 15 U/L (ref 9–46)
AST: 29 U/L (ref 10–35)
Albumin: 4 g/dL (ref 3.6–5.1)
Alkaline phosphatase (APISO): 73 U/L (ref 35–144)
BUN: 18 mg/dL (ref 7–25)
CO2: 32 mmol/L (ref 20–32)
Calcium: 9.3 mg/dL (ref 8.6–10.3)
Chloride: 104 mmol/L (ref 98–110)
Creat: 1.24 mg/dL (ref 0.70–1.33)
GFR, Est African American: 74 mL/min/{1.73_m2} (ref >=60)
GFR, Est Non African American: 64 mL/min/{1.73_m2} (ref >=60)
Globulin: 3.2 g/dL (calc) (ref 1.9–3.7)
Glucose, Bld: 89 mg/dL (ref 65–99)
Potassium: 4.1 mmol/L (ref 3.5–5.3)
Sodium: 141 mmol/L (ref 135–146)
Total Bilirubin: 0.4 mg/dL (ref 0.2–1.2)
Total Protein: 7.2 g/dL (ref 6.1–8.1)

## 2020-03-27 LAB — CBC WITH DIFFERENTIAL/PLATELET
Absolute Monocytes: 334 cells/uL (ref 200–950)
Basophils Absolute: 19 cells/uL (ref 0–200)
Basophils Relative: 0.4 %
Eosinophils Absolute: 141 cells/uL (ref 15–500)
Eosinophils Relative: 3 %
HCT: 44.4 % (ref 38.5–50.0)
Hemoglobin: 14.6 g/dL (ref 13.2–17.1)
Lymphs Abs: 931 cells/uL (ref 850–3900)
MCH: 30 pg (ref 27.0–33.0)
MCHC: 32.9 g/dL (ref 32.0–36.0)
MCV: 91.2 fL (ref 80.0–100.0)
MPV: 11.7 fL (ref 7.5–12.5)
Monocytes Relative: 7.1 %
Neutro Abs: 3276 cells/uL (ref 1500–7800)
Neutrophils Relative %: 69.7 %
Platelets: 178 10*3/uL (ref 140–400)
RBC: 4.87 10*6/uL (ref 4.20–5.80)
RDW: 13.2 % (ref 11.0–15.0)
Total Lymphocyte: 19.8 %
WBC: 4.7 10*3/uL (ref 3.8–10.8)

## 2020-03-27 LAB — TSH: TSH: 1.7 mIU/L (ref 0.40–4.50)

## 2020-03-27 LAB — PSA: PSA: 0.3 ng/mL (ref <=4.0)

## 2020-04-09 ENCOUNTER — Ambulatory Visit: Payer: BC Managed Care – PPO | Admitting: Adult Health

## 2020-04-09 ENCOUNTER — Encounter: Payer: Self-pay | Admitting: Adult Health

## 2020-04-09 ENCOUNTER — Other Ambulatory Visit: Payer: Self-pay

## 2020-04-09 VITALS — BP 130/80 | Temp 97.9°F | Wt 143.0 lb

## 2020-04-09 DIAGNOSIS — I10 Essential (primary) hypertension: Secondary | ICD-10-CM | POA: Diagnosis not present

## 2020-04-09 MED ORDER — AMLODIPINE BESYLATE 5 MG PO TABS: 5.0000 mg | ORAL_TABLET | Freq: Every day | ORAL | 3 refills | Status: DC

## 2020-04-09 NOTE — Progress Notes (Signed)
Subjective:    Patient ID: Logan Larson, male    DOB: 1960-12-18, 59 y.o.   MRN: [id number]  HPI  59 year old male who  has a past medical history of Essential hypertension and Frequent headaches.  He presents to the office today for follow up regarding hypertension. He was previously prescribed lisinopril but this caused a cough. During his visit two weeks ago his medication was changed from lisinopril to Norvasc.  He reports that since the change his cough has resolved.  He is monitoring his blood pressures at home and reports readings in the 120s to 130s over 70s to 80s.  He denies side effects such as lightheadedness, dizziness, shortness of breath, syncopal episodes, or lower extremity edema.  Review of Systems See HPI   Past Medical History:  Diagnosis Date  . Essential hypertension   . Frequent headaches     Social History   Socioeconomic History  . Marital status: Single    Spouse name: Not on file  . Number of children: Not on file  . Years of education: Not on file  . Highest education level: Not on file  Occupational History  . Not on file  Tobacco Use  . Smoking status: Never Smoker  . Smokeless tobacco: Never Used  Substance and Sexual Activity  . Alcohol use: No  . Drug use: No  . Sexual activity: Yes  Other Topics Concern  . Not on file  Social History Narrative  . Not on file   Social Determinants of Health   Financial Resource Strain:   . Difficulty of Paying Living Expenses:   Food Insecurity:   . Worried About Programme researcher, broadcasting/film/video in the Last Year:   . Barista in the Last Year:   Transportation Needs:   . Freight forwarder (Medical):   Marland Kitchen Lack of Transportation (Non-Medical):   Physical Activity:   . Days of Exercise per Week:   . Minutes of Exercise per Session:   Stress:   . Feeling of Stress :   Social Connections:   . Frequency of Communication with Friends and Family:   . Frequency of Social Gatherings with Friends and  Family:   . Attends Religious Services:   . Active Member of Clubs or Organizations:   . Attends Banker Meetings:   Marland Kitchen Marital Status:   Intimate Partner Violence:   . Fear of Current or Ex-Partner:   . Emotionally Abused:   Marland Kitchen Physically Abused:   . Sexually Abused:     Past Surgical History:  Procedure Laterality Date  . KNEE SURGERY Right 2015  . WISDOM TOOTH EXTRACTION      Family History  Problem Relation Age of Onset  . Heart disease Mother   . Hypertension Mother   . Diabetes Mother   . Colon cancer Neg Hx   . Colon polyps Neg Hx   . Esophageal cancer Neg Hx   . Rectal cancer Neg Hx   . Stomach cancer Neg Hx     Allergies  Allergen Reactions  . Lisinopril Cough    Current Outpatient Medications on File Prior to Visit  Medication Sig Dispense Refill  . amLODipine (NORVASC) 5 MG tablet Take 1 tablet (5 mg total) by mouth daily. 30 tablet 0   No current facility-administered medications on file prior to visit.    BP 130/80   Temp 97.9 F (36.6 C)   Wt 143 lb (64.9 kg)  BMI 19.39 kg/m       Objective:   Physical Exam Vitals and nursing note reviewed.  Constitutional:      Appearance: Normal appearance.  Cardiovascular:     Rate and Rhythm: Normal rate and regular rhythm.     Pulses: Normal pulses.     Heart sounds: Normal heart sounds.  Pulmonary:     Effort: Pulmonary effort is normal.     Breath sounds: Normal breath sounds.  Musculoskeletal:     Right lower leg: No edema.     Left lower leg: No edema.  Skin:    General: Skin is warm and dry.  Neurological:     General: No focal deficit present.     Mental Status: He is alert and oriented to person, place, and time.  Psychiatric:        Mood and Affect: Mood normal.        Behavior: Behavior normal.        Thought Content: Thought content normal.        Judgment: Judgment normal.       Assessment & Plan:

## 2020-06-13 ENCOUNTER — Other Ambulatory Visit: Payer: Self-pay | Admitting: Adult Health

## 2020-06-13 DIAGNOSIS — I10 Essential (primary) hypertension: Secondary | ICD-10-CM

## 2021-12-01 ENCOUNTER — Other Ambulatory Visit: Payer: Self-pay | Admitting: Adult Health

## 2021-12-01 DIAGNOSIS — I10 Essential (primary) hypertension: Secondary | ICD-10-CM

## 2021-12-29 ENCOUNTER — Encounter: Payer: Self-pay | Admitting: Adult Health

## 2021-12-29 ENCOUNTER — Ambulatory Visit (INDEPENDENT_AMBULATORY_CARE_PROVIDER_SITE_OTHER): Payer: BC Managed Care – PPO | Admitting: Adult Health

## 2021-12-29 VITALS — BP 150/80 | HR 69 | Temp 98.4°F | Ht 71.0 in | Wt 154.0 lb

## 2021-12-29 DIAGNOSIS — Z125 Encounter for screening for malignant neoplasm of prostate: Secondary | ICD-10-CM

## 2021-12-29 DIAGNOSIS — Z23 Encounter for immunization: Secondary | ICD-10-CM | POA: Diagnosis not present

## 2021-12-29 DIAGNOSIS — I10 Essential (primary) hypertension: Secondary | ICD-10-CM

## 2021-12-29 DIAGNOSIS — Z0000 Encounter for general adult medical examination without abnormal findings: Secondary | ICD-10-CM | POA: Diagnosis not present

## 2021-12-29 DIAGNOSIS — Z114 Encounter for screening for human immunodeficiency virus [HIV]: Secondary | ICD-10-CM | POA: Diagnosis not present

## 2021-12-29 LAB — CBC WITH DIFFERENTIAL/PLATELET
Basophils Absolute: 0 10*3/uL (ref 0.0–0.1)
Basophils Relative: 0.4 % (ref 0.0–3.0)
Eosinophils Absolute: 0.1 10*3/uL (ref 0.0–0.7)
Eosinophils Relative: 1.3 % (ref 0.0–5.0)
HCT: 44.4 % (ref 39.0–52.0)
Hemoglobin: 14.8 g/dL (ref 13.0–17.0)
Lymphocytes Relative: 23.4 % (ref 12.0–46.0)
Lymphs Abs: 0.9 10*3/uL (ref 0.7–4.0)
MCHC: 33.3 g/dL (ref 30.0–36.0)
MCV: 89.3 fl (ref 78.0–100.0)
Monocytes Absolute: 0.3 10*3/uL (ref 0.1–1.0)
Monocytes Relative: 8.1 % (ref 3.0–12.0)
Neutro Abs: 2.5 10*3/uL (ref 1.4–7.7)
Neutrophils Relative %: 66.8 % (ref 43.0–77.0)
Platelets: 143 10*3/uL — ABNORMAL LOW (ref 150.0–400.0)
RBC: 4.97 Mil/uL (ref 4.22–5.81)
RDW: 14.7 % (ref 11.5–15.5)
WBC: 3.8 10*3/uL — ABNORMAL LOW (ref 4.0–10.5)

## 2021-12-29 LAB — COMPREHENSIVE METABOLIC PANEL
ALT: 22 U/L (ref 0–53)
AST: 49 U/L — ABNORMAL HIGH (ref 0–37)
Albumin: 4.2 g/dL (ref 3.5–5.2)
Alkaline Phosphatase: 68 U/L (ref 39–117)
BUN: 14 mg/dL (ref 6–23)
CO2: 31 mEq/L (ref 19–32)
Calcium: 8.7 mg/dL (ref 8.4–10.5)
Chloride: 101 mEq/L (ref 96–112)
Creatinine, Ser: 1.15 mg/dL (ref 0.40–1.50)
GFR: 69.84 mL/min (ref >60.00)
Glucose, Bld: 87 mg/dL (ref 70–99)
Potassium: 3.8 mEq/L (ref 3.5–5.1)
Sodium: 137 mEq/L (ref 135–145)
Total Bilirubin: 0.9 mg/dL (ref 0.2–1.2)
Total Protein: 7.3 g/dL (ref 6.0–8.3)

## 2021-12-29 LAB — LIPID PANEL
Cholesterol: 167 mg/dL (ref 0–200)
HDL: 72.8 mg/dL (ref >39.00)
LDL Cholesterol: 87 mg/dL (ref 0–99)
NonHDL: 94.07
Total CHOL/HDL Ratio: 2
Triglycerides: 34 mg/dL (ref 0.0–149.0)
VLDL: 6.8 mg/dL (ref 0.0–40.0)

## 2021-12-29 LAB — TSH: TSH: 2.24 u[IU]/mL (ref 0.35–5.50)

## 2021-12-29 LAB — PSA: PSA: 0.24 ng/mL (ref 0.10–4.00)

## 2021-12-29 NOTE — Progress Notes (Signed)
? ?Subjective:  ? ? Patient ID: Logan Larson, male    DOB: 05/14/1961, 62 y.o.   MRN: [id number] ? ?HPI ?Patient presents for yearly preventative medicine examination. He is a pleasant 61 year old male who  has a past medical history of Essential hypertension and Frequent headaches. ? ?Hypertension -currently managed with Norvasc 5 mg daily. He reports that he stopped taking this medication a few months ago as it was making him feel too fatigued.  ?BP Readings from Last 3 Encounters:  ?12/29/21 (!) 150/80  ?04/09/20 130/80  ?03/26/20 140/88  ? ?All immunizations and health maintenance protocols were reviewed with the patient and needed orders were placed. ? ?Appropriate screening laboratory values were ordered for the patient including screening of hyperlipidemia, renal function and hepatic function. ?If indicated by BPH, a PSA was ordered. ? ?Medication reconciliation,  past medical history, social history, problem list and allergies were reviewed in detail with the patient ? ?Goals were established with regard to weight loss, exercise, and  diet in compliance with medications ?Wt Readings from Last 3 Encounters:  ?12/29/21 154 lb (69.9 kg)  ?04/09/20 143 lb (64.9 kg)  ?03/26/20 140 lb (63.5 kg)  ? ?He is up to date on routine colon cancer screening  ? ?Review of Systems  ?Constitutional: Negative.   ?HENT: Negative.    ?Eyes: Negative.   ?Respiratory: Negative.    ?Cardiovascular: Negative.   ?Gastrointestinal: Negative.   ?Endocrine: Negative.   ?Genitourinary: Negative.   ?Musculoskeletal: Negative.   ?Skin: Negative.   ?Allergic/Immunologic: Negative.   ?Neurological: Negative.   ?Hematological: Negative.   ?Psychiatric/Behavioral: Negative.    ?All other systems reviewed and are negative. ? ?Past Medical History:  ?Diagnosis Date  ? Essential hypertension   ? Frequent headaches   ? ? ?Social History  ? ?Socioeconomic History  ? Marital status: Single  ?  Spouse name: Not on file  ? Number of children: Not on  file  ? Years of education: Not on file  ? Highest education level: Not on file  ?Occupational History  ? Not on file  ?Tobacco Use  ? Smoking status: Never  ? Smokeless tobacco: Never  ?Substance and Sexual Activity  ? Alcohol use: No  ? Drug use: No  ? Sexual activity: Yes  ?Other Topics Concern  ? Not on file  ?Social History Narrative  ? Not on file  ? ?Social Determinants of Health  ? ?Financial Resource Strain: Not on file  ?Food Insecurity: Not on file  ?Transportation Needs: Not on file  ?Physical Activity: Not on file  ?Stress: Not on file  ?Social Connections: Not on file  ?Intimate Partner Violence: Not on file  ? ? ?Past Surgical History:  ?Procedure Laterality Date  ? KNEE SURGERY Right 2015  ? WISDOM TOOTH EXTRACTION    ? ? ?Family History  ?Problem Relation Age of Onset  ? Heart disease Mother   ? Hypertension Mother   ? Diabetes Mother   ? Colon cancer Neg Hx   ? Colon polyps Neg Hx   ? Esophageal cancer Neg Hx   ? Rectal cancer Neg Hx   ? Stomach cancer Neg Hx   ? ? ?Allergies  ?Allergen Reactions  ? Lisinopril Cough  ? ? ?Current Outpatient Medications on File Prior to Visit  ?Medication Sig Dispense Refill  ? amLODipine (NORVASC) 5 MG tablet Take 1 tablet (5 mg total) by mouth daily. 30 tablet 0  ? ?No current facility-administered medications on file  prior to visit.  ? ? ?BP (!) 150/80   Pulse 69   Temp 98.4 ?F (36.9 ?C) (Oral)   Ht 5\' 11"  (1.803 m)   Wt 154 lb (69.9 kg)   SpO2 97%   BMI 21.48 kg/m?  ? ? ?   ?Objective:  ? Physical Exam ?Vitals and nursing note reviewed.  ?Constitutional:   ?   General: He is not in acute distress. ?   Appearance: Normal appearance. He is well-developed and normal weight.  ?HENT:  ?   Head: Normocephalic and atraumatic.  ?   Right Ear: Tympanic membrane, ear canal and external ear normal. There is no impacted cerumen.  ?   Left Ear: Tympanic membrane, ear canal and external ear normal. There is no impacted cerumen.  ?   Nose: Nose normal. No congestion or  rhinorrhea.  ?   Mouth/Throat:  ?   Mouth: Mucous membranes are moist.  ?   Pharynx: Oropharynx is clear. No oropharyngeal exudate or posterior oropharyngeal erythema.  ?Eyes:  ?   General:     ?   Right eye: No discharge.     ?   Left eye: No discharge.  ?   Extraocular Movements: Extraocular movements intact.  ?   Conjunctiva/sclera: Conjunctivae normal.  ?   Pupils: Pupils are equal, round, and reactive to light.  ?Neck:  ?   Vascular: No carotid bruit.  ?   Trachea: No tracheal deviation.  ?Cardiovascular:  ?   Rate and Rhythm: Normal rate and regular rhythm.  ?   Pulses: Normal pulses.  ?   Heart sounds: Normal heart sounds. No murmur heard. ?  No friction rub. No gallop.  ?Pulmonary:  ?   Effort: Pulmonary effort is normal. No respiratory distress.  ?   Breath sounds: Normal breath sounds. No stridor. No wheezing, rhonchi or rales.  ?Chest:  ?   Chest wall: No tenderness.  ?Abdominal:  ?   General: Bowel sounds are normal. There is no distension.  ?   Palpations: Abdomen is soft. There is no mass.  ?   Tenderness: There is no abdominal tenderness. There is no right CVA tenderness, left CVA tenderness, guarding or rebound.  ?   Hernia: No hernia is present.  ?Musculoskeletal:     ?   General: No swelling, tenderness, deformity or signs of injury. Normal range of motion.  ?   Right lower leg: No edema.  ?   Left lower leg: No edema.  ?Lymphadenopathy:  ?   Cervical: No cervical adenopathy.  ?Skin: ?   General: Skin is warm and dry.  ?   Capillary Refill: Capillary refill takes less than 2 seconds.  ?   Coloration: Skin is not jaundiced or pale.  ?   Findings: No bruising, erythema, lesion or rash.  ?Neurological:  ?   General: No focal deficit present.  ?   Mental Status: He is alert and oriented to person, place, and time.  ?   Cranial Nerves: No cranial nerve deficit.  ?   Sensory: No sensory deficit.  ?   Motor: No weakness.  ?   Coordination: Coordination normal.  ?   Gait: Gait normal.  ?   Deep Tendon  Reflexes: Reflexes normal.  ?Psychiatric:     ?   Mood and Affect: Mood normal.     ?   Behavior: Behavior normal.     ?   Thought Content: Thought content normal.     ?  Judgment: Judgment normal.  ? ?   ?Assessment & Plan:  ? ?1. Routine general medical examination at a health care facility ?- Continue to stay active and eat healthy  ?- Follow up in one year or sooner if needed ?- CBC with Differential/Platelet; Future ?- Comprehensive metabolic panel; Future ?- Lipid panel; Future ?- TSH; Future ? ?2. Essential hypertension ?- Will have him start taking Norvasc at night  ?- Follow up if fatigue continues  ?- CBC with Differential/Platelet; Future ?- Comprehensive metabolic panel; Future ?- Lipid panel; Future ?- TSH; Future ? ?3. Prostate cancer screening ? ?- PSA; Future ? ?4. Encounter for screening for HIV ? ?- HIV Antibody (routine testing w rflx); Future ? ? ?5. Need for shingles vaccine ? ?- Varicella-zoster vaccine IM ? ?Shirline Freesory Angeliz Settlemyre, NP ? ?

## 2021-12-29 NOTE — Patient Instructions (Addendum)
It was great seeing you today  ? ?We will follow up with you regarding your lab work  ? ?Please let me know if you need anything  ? ?Please schedule a 3 month follow up for your second shingles vaccination  ? ?Start taking your Norvasc at night  ? ?

## 2021-12-30 LAB — HIV ANTIBODY (ROUTINE TESTING W REFLEX): HIV 1&2 Ab, 4th Generation: NONREACTIVE

## 2022-01-03 ENCOUNTER — Other Ambulatory Visit: Payer: Self-pay | Admitting: Adult Health

## 2022-01-03 DIAGNOSIS — I10 Essential (primary) hypertension: Secondary | ICD-10-CM

## 2023-06-13 DIAGNOSIS — M9901 Segmental and somatic dysfunction of cervical region: Secondary | ICD-10-CM | POA: Diagnosis not present

## 2023-06-13 DIAGNOSIS — M7918 Myalgia, other site: Secondary | ICD-10-CM | POA: Diagnosis not present

## 2023-06-13 DIAGNOSIS — M25512 Pain in left shoulder: Secondary | ICD-10-CM | POA: Diagnosis not present

## 2023-06-13 DIAGNOSIS — M9902 Segmental and somatic dysfunction of thoracic region: Secondary | ICD-10-CM | POA: Diagnosis not present

## 2023-06-13 DIAGNOSIS — M542 Cervicalgia: Secondary | ICD-10-CM | POA: Diagnosis not present

## 2023-06-17 DIAGNOSIS — M542 Cervicalgia: Secondary | ICD-10-CM | POA: Diagnosis not present

## 2023-06-17 DIAGNOSIS — M25512 Pain in left shoulder: Secondary | ICD-10-CM | POA: Diagnosis not present

## 2023-06-17 DIAGNOSIS — M9902 Segmental and somatic dysfunction of thoracic region: Secondary | ICD-10-CM | POA: Diagnosis not present

## 2023-06-17 DIAGNOSIS — M7918 Myalgia, other site: Secondary | ICD-10-CM | POA: Diagnosis not present

## 2023-06-17 DIAGNOSIS — M9901 Segmental and somatic dysfunction of cervical region: Secondary | ICD-10-CM | POA: Diagnosis not present

## 2023-10-25 ENCOUNTER — Ambulatory Visit (INDEPENDENT_AMBULATORY_CARE_PROVIDER_SITE_OTHER): Payer: BC Managed Care – PPO | Admitting: Adult Health

## 2023-10-25 VITALS — BP 150/80 | HR 69 | Temp 97.8°F | Ht 72.0 in | Wt 152.0 lb

## 2023-10-25 DIAGNOSIS — Z125 Encounter for screening for malignant neoplasm of prostate: Secondary | ICD-10-CM | POA: Diagnosis not present

## 2023-10-25 DIAGNOSIS — I10 Essential (primary) hypertension: Secondary | ICD-10-CM | POA: Diagnosis not present

## 2023-10-25 DIAGNOSIS — Z23 Encounter for immunization: Secondary | ICD-10-CM

## 2023-10-25 DIAGNOSIS — Z0000 Encounter for general adult medical examination without abnormal findings: Secondary | ICD-10-CM

## 2023-10-25 LAB — LIPID PANEL
Cholesterol: 201 mg/dL — ABNORMAL HIGH (ref 0–200)
HDL: 65.5 mg/dL (ref >39.00)
LDL Cholesterol: 125 mg/dL — ABNORMAL HIGH (ref 0–99)
NonHDL: 135.06
Total CHOL/HDL Ratio: 3
Triglycerides: 49 mg/dL (ref 0.0–149.0)
VLDL: 9.8 mg/dL (ref 0.0–40.0)

## 2023-10-25 LAB — COMPREHENSIVE METABOLIC PANEL
ALT: 8 U/L (ref 0–53)
AST: 19 U/L (ref 0–37)
Albumin: 4.2 g/dL (ref 3.5–5.2)
Alkaline Phosphatase: 75 U/L (ref 39–117)
BUN: 19 mg/dL (ref 6–23)
CO2: 32 meq/L (ref 19–32)
Calcium: 9.1 mg/dL (ref 8.4–10.5)
Chloride: 104 meq/L (ref 96–112)
Creatinine, Ser: 1.19 mg/dL (ref 0.40–1.50)
GFR: 66.18 mL/min (ref >60.00)
Glucose, Bld: 87 mg/dL (ref 70–99)
Potassium: 4.2 meq/L (ref 3.5–5.1)
Sodium: 141 meq/L (ref 135–145)
Total Bilirubin: 0.5 mg/dL (ref 0.2–1.2)
Total Protein: 7.4 g/dL (ref 6.0–8.3)

## 2023-10-25 LAB — CBC
HCT: 46 % (ref 39.0–52.0)
Hemoglobin: 15 g/dL (ref 13.0–17.0)
MCHC: 32.7 g/dL (ref 30.0–36.0)
MCV: 91.1 fl (ref 78.0–100.0)
Platelets: 168 10*3/uL (ref 150.0–400.0)
RBC: 5.04 Mil/uL (ref 4.22–5.81)
RDW: 14 % (ref 11.5–15.5)
WBC: 3.8 10*3/uL — ABNORMAL LOW (ref 4.0–10.5)

## 2023-10-25 LAB — PSA: PSA: 0.54 ng/mL (ref 0.10–4.00)

## 2023-10-25 LAB — TSH: TSH: 2.49 u[IU]/mL (ref 0.35–5.50)

## 2023-10-25 MED ORDER — OLMESARTAN MEDOXOMIL 20 MG PO TABS: 20.0000 mg | ORAL_TABLET | Freq: Every day | ORAL | 0 refills | Status: DC

## 2023-10-25 NOTE — Progress Notes (Signed)
 Subjective:    Patient ID: Logan Larson, male    DOB: 06-03-1961, 63 y.o.   MRN: [id number]  HPI Patient presents for yearly preventative medicine examination. He is a pleasant 63 year old male who  has a past medical history of Essential hypertension and Frequent headaches.  Hypertension -was prescribed Norvasc but this caused him to feel drowsy. He then tried switching to taking it at night but he still " felt weird" so he stopped it. He denies headaches or blurred vision.  BP Readings from Last 3 Encounters:  10/25/23 (!) 150/80  12/29/21 (!) 150/80  04/09/20 130/80    All immunizations and health maintenance protocols were reviewed with the patient and needed orders were placed.  Appropriate screening laboratory values were ordered for the patient including screening of hyperlipidemia, renal function and hepatic function. If indicated by BPH, a PSA was ordered.  Medication reconciliation,  past medical history, social history, problem list and allergies were reviewed in detail with the patient  Goals were established with regard to weight loss, exercise, and  diet in compliance with medications  Wt Readings from Last 3 Encounters:  10/25/23 152 lb (68.9 kg)  12/29/21 154 lb (69.9 kg)  04/09/20 143 lb (64.9 kg)   He is up to date on routine colon cancer screening    Review of Systems  Constitutional: Negative.   HENT: Negative.    Eyes: Negative.   Respiratory: Negative.    Cardiovascular: Negative.   Gastrointestinal: Negative.   Endocrine: Negative.   Genitourinary: Negative.   Musculoskeletal: Negative.   Skin: Negative.   Allergic/Immunologic: Negative.   Neurological: Negative.   Hematological: Negative.   Psychiatric/Behavioral: Negative.    All other systems reviewed and are negative.  Past Medical History:  Diagnosis Date   Essential hypertension    Frequent headaches     Social History   Socioeconomic History   Marital status: Single     Spouse name: Not on file   Number of children: Not on file   Years of education: Not on file   Highest education level: Not on file  Occupational History   Not on file  Tobacco Use   Smoking status: Never   Smokeless tobacco: Never  Substance and Sexual Activity   Alcohol use: No   Drug use: No   Sexual activity: Yes  Other Topics Concern   Not on file  Social History Narrative   Not on file   Social Drivers of Health   Financial Resource Strain: Not on file  Food Insecurity: Not on file  Transportation Needs: Not on file  Physical Activity: Not on file  Stress: Not on file  Social Connections: Not on file  Intimate Partner Violence: Not on file    Past Surgical History:  Procedure Laterality Date   KNEE SURGERY Right 2015   WISDOM TOOTH EXTRACTION      Family History  Problem Relation Age of Onset   Heart disease Mother    Hypertension Mother    Diabetes Mother    Colon cancer Neg Hx    Colon polyps Neg Hx    Esophageal cancer Neg Hx    Rectal cancer Neg Hx    Stomach cancer Neg Hx     Allergies  Allergen Reactions   Lisinopril Cough    Current Outpatient Medications on File Prior to Visit  Medication Sig Dispense Refill   amLODipine (NORVASC) 5 MG tablet Take 1 tablet (5 mg total) by mouth  daily. 30 tablet 5   No current facility-administered medications on file prior to visit.    BP (!) 150/80   Pulse 69   Temp 97.8 F (36.6 C) (Oral)   Ht 6' (1.829 m)   Wt 152 lb (68.9 kg)   SpO2 98%   BMI 20.61 kg/m       Objective:   Physical Exam Vitals and nursing note reviewed.  Constitutional:      General: He is not in acute distress.    Appearance: Normal appearance. He is not ill-appearing.  HENT:     Head: Normocephalic and atraumatic.     Right Ear: Tympanic membrane, ear canal and external ear normal. There is no impacted cerumen.     Left Ear: Tympanic membrane, ear canal and external ear normal. There is no impacted cerumen.     Nose:  Nose normal. No congestion or rhinorrhea.     Mouth/Throat:     Mouth: Mucous membranes are moist.     Pharynx: Oropharynx is clear.  Eyes:     Extraocular Movements: Extraocular movements intact.     Conjunctiva/sclera: Conjunctivae normal.     Pupils: Pupils are equal, round, and reactive to light.  Neck:     Vascular: No carotid bruit.  Cardiovascular:     Rate and Rhythm: Normal rate and regular rhythm.     Pulses: Normal pulses.     Heart sounds: No murmur heard.    No friction rub. No gallop.  Pulmonary:     Effort: Pulmonary effort is normal.     Breath sounds: Normal breath sounds.  Abdominal:     General: Abdomen is flat. Bowel sounds are normal. There is no distension.     Palpations: Abdomen is soft. There is no mass.     Tenderness: There is no abdominal tenderness. There is no guarding or rebound.     Hernia: No hernia is present.  Musculoskeletal:        General: Normal range of motion.     Cervical back: Normal range of motion and neck supple.  Lymphadenopathy:     Cervical: No cervical adenopathy.  Skin:    General: Skin is warm and dry.     Capillary Refill: Capillary refill takes less than 2 seconds.  Neurological:     General: No focal deficit present.     Mental Status: He is alert and oriented to person, place, and time.  Psychiatric:        Mood and Affect: Mood normal.        Behavior: Behavior normal.        Thought Content: Thought content normal.        Judgment: Judgment normal.       Assessment & Plan:  1. Routine general medical examination at a health care facility (Primary) Today patient counseled on age appropriate routine health concerns for screening and prevention, each reviewed and up to date or declined. Immunizations reviewed and up to date or declined. Labs ordered and reviewed. Risk factors for depression reviewed and negative. Hearing function and visual acuity are intact. ADLs screened and addressed as needed. Functional ability  and level of safety reviewed and appropriate. Education, counseling and referrals performed based on assessed risks today. Patient provided with a copy of personalized plan for preventive services.   2. Essential hypertension - will trial him on Benicar 30 mg  - Follow up in 30 days  - Lipid panel; Future - TSH; Future - CBC;  Future - Comprehensive metabolic panel; Future - olmesartan (BENICAR) 20 MG tablet; Take 1 tablet (20 mg total) by mouth daily.  Dispense: 90 tablet; Refill: 0  3. Prostate cancer screening  - PSA; Future  4. Need for shingles vaccine  - Zoster Recombinant (Shingrix )  Shirline Frees, NP

## 2023-10-25 NOTE — Patient Instructions (Addendum)
 It was great seeing you today   We will follow up with you regarding your lab work   Please let me know if you need anything   I am going to start you on a new blood pressure medication called Benicar. Take this daily and follow up in 30 days

## 2023-11-25 ENCOUNTER — Encounter: Payer: Self-pay | Admitting: Adult Health

## 2023-11-25 ENCOUNTER — Ambulatory Visit: Admitting: Adult Health

## 2023-11-25 VITALS — BP 150/80 | HR 56 | Temp 97.7°F | Ht 72.0 in | Wt 150.0 lb

## 2023-11-25 DIAGNOSIS — I10 Essential (primary) hypertension: Secondary | ICD-10-CM

## 2023-11-25 LAB — BASIC METABOLIC PANEL WITH GFR
BUN: 19 mg/dL (ref 6–23)
CO2: 30 meq/L (ref 19–32)
Calcium: 9 mg/dL (ref 8.4–10.5)
Chloride: 105 meq/L (ref 96–112)
Creatinine, Ser: 1.06 mg/dL (ref 0.40–1.50)
GFR: 75.99 mL/min (ref >60.00)
Glucose, Bld: 94 mg/dL (ref 70–99)
Potassium: 4.1 meq/L (ref 3.5–5.1)
Sodium: 141 meq/L (ref 135–145)

## 2023-11-25 NOTE — Progress Notes (Signed)
 Subjective:    Patient ID: Logan Larson, male    DOB: 25-Jun-1961, 63 y.o.   MRN: [id number]  HPI 63 year old male who  has a past medical history of Essential hypertension and Frequent headaches.  He presents to the office today for 1 month follow-up regarding hypertension.  When he was last seen he reported that his prescribed Norvasc had caused him to feel drowsy when taking the medication, he did try switching to taking at night but still felt drowsy so he stopped it.  Pressure was elevated at 150/80.  He was started on Benicar 20 mg daily. Today he reports that he has not had any issues with the medication. He has not been checking his blood pressure at home.   He denies dizziness, headaches or blurred vision.   Review of Systems See HPI   Past Medical History:  Diagnosis Date   Essential hypertension    Frequent headaches     Social History   Socioeconomic History   Marital status: Single    Spouse name: Not on file   Number of children: Not on file   Years of education: Not on file   Highest education level: Not on file  Occupational History   Not on file  Tobacco Use   Smoking status: Never   Smokeless tobacco: Never  Substance and Sexual Activity   Alcohol use: No   Drug use: No   Sexual activity: Yes  Other Topics Concern   Not on file  Social History Narrative   Not on file   Social Drivers of Health   Financial Resource Strain: Not on file  Food Insecurity: Not on file  Transportation Needs: Not on file  Physical Activity: Not on file  Stress: Not on file  Social Connections: Not on file  Intimate Partner Violence: Not on file    Past Surgical History:  Procedure Laterality Date   KNEE SURGERY Right 2015   WISDOM TOOTH EXTRACTION      Family History  Problem Relation Age of Onset   Heart disease Mother    Hypertension Mother    Diabetes Mother    Colon cancer Neg Hx    Colon polyps Neg Hx    Esophageal cancer Neg Hx    Rectal cancer  Neg Hx    Stomach cancer Neg Hx     Allergies  Allergen Reactions   Lisinopril Cough    Current Outpatient Medications on File Prior to Visit  Medication Sig Dispense Refill   olmesartan (BENICAR) 20 MG tablet Take 1 tablet (20 mg total) by mouth daily. 90 tablet 0   No current facility-administered medications on file prior to visit.    BP (!) 150/80   Pulse (!) 56   Temp 97.7 F (36.5 C) (Oral)   Ht 6' (1.829 m)   Wt 150 lb (68 kg)   SpO2 99%   BMI 20.34 kg/m       Objective:   Physical Exam Vitals and nursing note reviewed.  Constitutional:      Appearance: Normal appearance.  Cardiovascular:     Rate and Rhythm: Normal rate and regular rhythm.     Pulses: Normal pulses.     Heart sounds: Normal heart sounds.  Pulmonary:     Effort: Pulmonary effort is normal.     Breath sounds: Normal breath sounds.  Musculoskeletal:        General: Normal range of motion.  Skin:    General: Skin  is warm and dry.  Neurological:     General: No focal deficit present.     Mental Status: He is alert and oriented to person, place, and time.  Psychiatric:        Mood and Affect: Mood normal.        Behavior: Behavior normal.        Thought Content: Thought content normal.        Judgment: Judgment normal.       Assessment & Plan:  1. Essential hypertension (Primary) - Not at goal. Will have him increase Benicar to 40 mg.  - Encouraged to check BP at home and bring log with him - Follow up in 30 days - Basic Metabolic Panel  Shirline Frees, NP  Time spent with patient today was 31 minutes which consisted of chart review, discussing HTN, work up, treatment answering questions and documentation.

## 2023-12-02 ENCOUNTER — Telehealth: Payer: Self-pay | Admitting: Adult Health
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
## 2023-12-27 ENCOUNTER — Ambulatory Visit: Admitting: Adult Health

## 2023-12-27 ENCOUNTER — Other Ambulatory Visit (INDEPENDENT_AMBULATORY_CARE_PROVIDER_SITE_OTHER)

## 2023-12-27 VITALS — BP 138/82 | HR 99 | Temp 98.1°F | Ht 72.0 in | Wt 148.0 lb

## 2023-12-27 DIAGNOSIS — I10 Essential (primary) hypertension: Secondary | ICD-10-CM

## 2023-12-27 LAB — BASIC METABOLIC PANEL WITH GFR
BUN: 18 mg/dL (ref 6–23)
CO2: 31 meq/L (ref 19–32)
Calcium: 9.1 mg/dL (ref 8.4–10.5)
Chloride: 105 meq/L (ref 96–112)
Creatinine, Ser: 1.12 mg/dL (ref 0.40–1.50)
GFR: 71.09 mL/min (ref >60.00)
Glucose, Bld: 87 mg/dL (ref 70–99)
Potassium: 4.1 meq/L (ref 3.5–5.1)
Sodium: 142 meq/L (ref 135–145)

## 2023-12-27 MED ORDER — OLMESARTAN MEDOXOMIL 40 MG PO TABS: 40.0000 mg | ORAL_TABLET | Freq: Every day | ORAL | 3 refills | Status: DC

## 2023-12-27 NOTE — Patient Instructions (Signed)
 Your blood pressure is in good range. I am going to keep you on this medication.   You can get your lab work done at [facility]  [address]  Lab is in the basement.

## 2023-12-27 NOTE — Progress Notes (Signed)
 Subjective:    Patient ID: Logan Larson, male    DOB: 1960/09/23, 63 y.o.   MRN: [id number]  Hypertension   63 year old male who  has a past medical history of Essential hypertension and Frequent headaches.  He presents to the office today for follow up regarding HTN. During his last visit a month ago Benicar  was increase from 20 mg to 40 mg daily.  He reports no side effects such as dizziness, lightheadedness, blurred vision or syncope. He has been checking his blood pressure at home with readings in the 120-130's/80's.   BP Readings from Last 3 Encounters:  12/27/23 138/82  11/25/23 (!) 150/80  10/25/23 (!) 150/80   Review of Systems See HPI   Past Medical History:  Diagnosis Date   Essential hypertension    Frequent headaches     Social History   Socioeconomic History   Marital status: Single    Spouse name: Not on file   Number of children: Not on file   Years of education: Not on file   Highest education level: Not on file  Occupational History   Not on file  Tobacco Use   Smoking status: Never   Smokeless tobacco: Never  Substance and Sexual Activity   Alcohol use: No   Drug use: No   Sexual activity: Yes  Other Topics Concern   Not on file  Social History Narrative   Not on file   Social Drivers of Health   Financial Resource Strain: Not on file  Food Insecurity: Not on file  Transportation Needs: Not on file  Physical Activity: Not on file  Stress: Not on file  Social Connections: Not on file  Intimate Partner Violence: Not on file    Past Surgical History:  Procedure Laterality Date   KNEE SURGERY Right 2015   WISDOM TOOTH EXTRACTION      Family History  Problem Relation Age of Onset   Heart disease Mother    Hypertension Mother    Diabetes Mother    Colon cancer Neg Hx    Colon polyps Neg Hx    Esophageal cancer Neg Hx    Rectal cancer Neg Hx    Stomach cancer Neg Hx     Allergies  Allergen Reactions   Lisinopril  Cough     Current Outpatient Medications on File Prior to Visit  Medication Sig Dispense Refill   olmesartan  (BENICAR ) 20 MG tablet Take 1 tablet (20 mg total) by mouth daily. 90 tablet 0   No current facility-administered medications on file prior to visit.    BP 138/82   Pulse 99   Temp 98.1 F (36.7 C) (Oral)   Ht 6' (1.829 m)   Wt 148 lb (67.1 kg)   SpO2 95%   BMI 20.07 kg/m       Objective:   Physical Exam Vitals and nursing note reviewed.  Constitutional:      Appearance: Normal appearance.  Cardiovascular:     Rate and Rhythm: Normal rate and regular rhythm.     Pulses: Normal pulses.     Heart sounds: Normal heart sounds.  Pulmonary:     Effort: Pulmonary effort is normal.     Breath sounds: Normal breath sounds.  Skin:    General: Skin is warm and dry.  Neurological:     General: No focal deficit present.     Mental Status: He is alert and oriented to person, place, and time.  Psychiatric:  Mood and Affect: Mood normal.        Behavior: Behavior normal.        Thought Content: Thought content normal.        Judgment: Judgment normal.       Assessment & Plan:  1. Essential hypertension (Primary) - Will keep him on Benicar  40 mg daily.  - Continue to monitor at home - olmesartan  (BENICAR ) 40 MG tablet; Take 1 tablet (40 mg total) by mouth daily.  Dispense: 90 tablet; Refill: 3 - Basic Metabolic Panel; Future  Alto Atta, NP

## 2024-01-21 ENCOUNTER — Other Ambulatory Visit: Payer: Self-pay | Admitting: Adult Health

## 2024-01-21 DIAGNOSIS — I10 Essential (primary) hypertension: Secondary | ICD-10-CM

## 2024-01-24 NOTE — Telephone Encounter (Signed)
  The original prescription was discontinued on 12/27/2023 by Alto Atta, NP. Renewing this prescription may not be appropriate.

## 2024-04-10 ENCOUNTER — Ambulatory Visit: Admitting: Adult Health

## 2024-04-10 ENCOUNTER — Encounter: Payer: Self-pay | Admitting: Adult Health

## 2024-04-10 VITALS — BP 150/100 | HR 64 | Temp 98.0°F | Ht 72.0 in | Wt 145.0 lb

## 2024-04-10 DIAGNOSIS — I10 Essential (primary) hypertension: Secondary | ICD-10-CM | POA: Diagnosis not present

## 2024-04-10 MED ORDER — OLMESARTAN MEDOXOMIL-HCTZ 20-12.5 MG PO TABS: 1.0000 | ORAL_TABLET | Freq: Every day | ORAL | 0 refills | Status: DC

## 2024-04-10 NOTE — Progress Notes (Signed)
 Subjective:    Patient ID: Logan Larson, male    DOB: [birth year], 63 y.o.   MRN: [id number]  HPI 63 year old male who  has a past medical history of Essential hypertension and Frequent headaches.  He presents to the office today for hypertension. He was on Benicar  40 mg and his blood pressure during his last check was well-controlled.  He reports that about a month ago he found his old the medication was making him sleepy did not feel like himself and like I was on drugs.  He stopped his Benicar  about a month ago and feels significantly better.  Unfortunately his blood pressure is elevated with readings in the 150s to 160s over 100s.  He denies symptoms such as headaches or blurred vision.    Review of Systems See HPI   Past Medical History:  Diagnosis Date   Essential hypertension    Frequent headaches     Social History   Socioeconomic History   Marital status: Single    Spouse name: Not on file   Number of children: Not on file   Years of education: Not on file   Highest education level: Not on file  Occupational History   Not on file  Tobacco Use   Smoking status: Never   Smokeless tobacco: Never  Substance and Sexual Activity   Alcohol use: No   Drug use: No   Sexual activity: Yes  Other Topics Concern   Not on file  Social History Narrative   Not on file   Social Drivers of Health   Financial Resource Strain: Not on file  Food Insecurity: Not on file  Transportation Needs: Not on file  Physical Activity: Not on file  Stress: Not on file  Social Connections: Not on file  Intimate Partner Violence: Not on file    Past Surgical History:  Procedure Laterality Date   KNEE SURGERY Right 2015   WISDOM TOOTH EXTRACTION      Family History  Problem Relation Age of Onset   Heart disease Mother    Hypertension Mother    Diabetes Mother    Colon cancer Neg Hx    Colon polyps Neg Hx    Esophageal cancer Neg Hx    Rectal cancer Neg Hx    Stomach  cancer Neg Hx     Allergies  Allergen Reactions   Lisinopril  Cough    Current Outpatient Medications on File Prior to Visit  Medication Sig Dispense Refill   olmesartan  (BENICAR ) 40 MG tablet Take 1 tablet (40 mg total) by mouth daily. (Patient not taking: Reported on 04/10/2024) 90 tablet 3   No current facility-administered medications on file prior to visit.    BP (!) 150/100   Pulse 64   Temp 98 F (36.7 C) (Oral)   Ht 6' (1.829 m)   Wt 145 lb (65.8 kg)   SpO2 98%   BMI 19.67 kg/m       Objective:   Physical Exam Vitals and nursing note reviewed.  Constitutional:      Appearance: Normal appearance.  Cardiovascular:     Rate and Rhythm: Normal rate and regular rhythm.     Pulses: Normal pulses.     Heart sounds: Normal heart sounds.  Pulmonary:     Effort: Pulmonary effort is normal.     Breath sounds: Normal breath sounds.  Skin:    General: Skin is warm and dry.  Neurological:     General: No focal  deficit present.     Mental Status: He is alert and oriented to person, place, and time.  Psychiatric:        Mood and Affect: Mood normal.        Behavior: Behavior normal.        Thought Content: Thought content normal.        Judgment: Judgment normal.        Assessment & Plan:  1. Essential hypertension (Primary) - BP not at goal. Will start on Benicar  HCT 20-12.5 mg  - Follow up in 30 days  - olmesartan -hydrochlorothiazide (BENICAR  HCT) 20-12.5 MG tablet; Take 1 tablet by mouth daily.  Dispense: 30 tablet; Refill: 0  Darleene Shape, NP  I personally spent a total of 31 minutes in the care of the patient today including preparing to see the patient, getting/reviewing separately obtained history, counseling and educating, placing orders, documenting clinical information in the EHR, and communicating results.

## 2024-04-10 NOTE — Patient Instructions (Signed)
 I am going to put you on Benicar /hydrochlorothiazide 20-12.5 mg daily.   Follow up in 30 days

## 2024-05-09 ENCOUNTER — Other Ambulatory Visit: Payer: Self-pay | Admitting: Adult Health

## 2024-05-09 DIAGNOSIS — I10 Essential (primary) hypertension: Secondary | ICD-10-CM

## 2024-05-09 NOTE — Telephone Encounter (Signed)
  The original prescription was discontinued on 12/27/2023 by Alto Atta, NP. Renewing this prescription may not be appropriate.

## 2024-05-11 ENCOUNTER — Encounter: Payer: Self-pay | Admitting: Adult Health

## 2024-05-11 ENCOUNTER — Ambulatory Visit: Admitting: Adult Health

## 2024-05-11 VITALS — BP 110/80 | HR 72 | Temp 98.0°F | Ht 72.0 in | Wt 144.0 lb

## 2024-05-11 DIAGNOSIS — I10 Essential (primary) hypertension: Secondary | ICD-10-CM

## 2024-05-11 MED ORDER — OLMESARTAN MEDOXOMIL-HCTZ 20-12.5 MG PO TABS: 1.0000 | ORAL_TABLET | Freq: Every day | ORAL | 3 refills | Status: DC

## 2024-05-11 NOTE — Progress Notes (Signed)
 Subjective:    Patient ID: Logan Larson, male    DOB: 1961-01-13, 64 y.o.   MRN: [id number]  HPI  63 year old male who  has a past medical history of Essential hypertension and Frequent headaches.  He presents to the office today for follow up regarding HTN. During his last visit a month ago his BP was not at goal despite taking Benicar  40 mg daily ( this was titrated up from 20 mg). When he started tthe 40 mg dose he reported fatigue and went back to the 20 mg dose and the fatigue improved. His BP was elevated though into the 150-160's systolic.  During the last visit we prescribed him Benicar  HTC 2-12.5 mg daily  Today he reports that his blood pressure has been well controlled at home with readings in the 110-120/70-80 range. He has not had any side effects and feels great overall.    Review of Systems See HPI   Past Medical History:  Diagnosis Date   Essential hypertension    Frequent headaches     Social History   Socioeconomic History   Marital status: Single    Spouse name: Not on file   Number of children: Not on file   Years of education: Not on file   Highest education level: Not on file  Occupational History   Not on file  Tobacco Use   Smoking status: Never   Smokeless tobacco: Never  Substance and Sexual Activity   Alcohol use: No   Drug use: No   Sexual activity: Yes  Other Topics Concern   Not on file  Social History Narrative   Not on file   Social Drivers of Health   Financial Resource Strain: Not on file  Food Insecurity: Not on file  Transportation Needs: Not on file  Physical Activity: Not on file  Stress: Not on file  Social Connections: Not on file  Intimate Partner Violence: Not on file    Past Surgical History:  Procedure Laterality Date   KNEE SURGERY Right 2015   WISDOM TOOTH EXTRACTION      Family History  Problem Relation Age of Onset   Heart disease Mother    Hypertension Mother    Diabetes Mother    Colon cancer  Neg Hx    Colon polyps Neg Hx    Esophageal cancer Neg Hx    Rectal cancer Neg Hx    Stomach cancer Neg Hx     Allergies  Allergen Reactions   Lisinopril  Cough    Current Outpatient Medications on File Prior to Visit  Medication Sig Dispense Refill   olmesartan -hydrochlorothiazide (BENICAR  HCT) 20-12.5 MG tablet TAKE 1 TABLET BY MOUTH EVERY DAY 30 tablet 0   No current facility-administered medications on file prior to visit.    BP 110/80   Pulse 72   Temp 98 F (36.7 C) (Oral)   Ht 6' (1.829 m)   Wt 144 lb (65.3 kg)   SpO2 97%   BMI 19.53 kg/m       Objective:   Physical Exam Vitals and nursing note reviewed.  Constitutional:      Appearance: Normal appearance.  Cardiovascular:     Rate and Rhythm: Normal rate and regular rhythm.     Pulses: Normal pulses.     Heart sounds: Normal heart sounds.  Pulmonary:     Effort: Pulmonary effort is normal.     Breath sounds: Normal breath sounds.  Neurological:  General: No focal deficit present.     Mental Status: He is oriented to person, place, and time.  Psychiatric:        Mood and Affect: Mood normal.        Behavior: Behavior normal.        Thought Content: Thought content normal.        Judgment: Judgment normal.        Assessment & Plan:  1. Essential hypertension (Primary) - At goal. Will keep him on Benicar  20-12.5 mg weekly.  - Basic Metabolic Panel; Future - olmesartan -hydrochlorothiazide (BENICAR  HCT) 20-12.5 MG tablet; Take 1 tablet by mouth daily.  Dispense: 90 tablet; Refill: 3  Rainn Bullinger, NP

## 2024-05-12 LAB — BASIC METABOLIC PANEL WITH GFR
BUN/Creatinine Ratio: 12 (calc) (ref 6–22)
BUN: 16 mg/dL (ref 7–25)
CO2: 32 mmol/L (ref 20–32)
Calcium: 9.2 mg/dL (ref 8.6–10.3)
Chloride: 104 mmol/L (ref 98–110)
Creat: 1.39 mg/dL — ABNORMAL HIGH (ref 0.70–1.35)
Glucose, Bld: 78 mg/dL (ref 65–99)
Potassium: 4.3 mmol/L (ref 3.5–5.3)
Sodium: 142 mmol/L (ref 135–146)
eGFR: 58 mL/min/1.73m2 — ABNORMAL LOW (ref >=60)

## 2024-05-15 ENCOUNTER — Ambulatory Visit: Payer: Self-pay | Admitting: Adult Health

## 2024-08-29 ENCOUNTER — Other Ambulatory Visit: Payer: Self-pay | Admitting: Adult Health

## 2024-08-29 DIAGNOSIS — I10 Essential (primary) hypertension: Secondary | ICD-10-CM

## 2024-08-29 NOTE — Telephone Encounter (Signed)
 Copied from CRM #[id number]. Topic: Clinical - Medication Refill >> Aug 29, 2024 11:21 AM Nessti S wrote: Medication: olmesartan -hydrochlorothiazide (BENICAR  HCT) 20-12.5 MG tablet  Has the patient contacted their pharmacy? No (Agent: If no, request that the patient contact the pharmacy for the refill. If patient does not wish to contact the pharmacy document the reason why and proceed with request.) (Agent: If yes, when and what did the pharmacy advise?)  This is the patient's preferred pharmacy:  [id number] - [place], [place] - [address] [place] [address] Phone: [phone] Fax: [phone]  Is this the correct pharmacy for this prescription? Yes If no, delete pharmacy and type the correct one.   Has the prescription been filled recently? No  Is the patient out of the medication? No  Has the patient been seen for an appointment in the last year OR does the patient have an upcoming appointment? Yes  Can we respond through MyChart? No  Agent: Please be advised that Rx refills may take up to 3 business days. We ask that you follow-up with your pharmacy.

## 2024-08-30 MED ORDER — OLMESARTAN MEDOXOMIL-HCTZ 20-12.5 MG PO TABS: 1.0000 | ORAL_TABLET | Freq: Every day | ORAL | 3 refills | Status: AC
